# Patient Record
Sex: Female | Born: 1950 | Race: White | Hispanic: No | Marital: Married | Smoking: Never smoker
Health system: Southern US, Community
[De-identification: ages and names within clinical notes are randomized; demographics above are authoritative.]

## PROBLEM LIST (undated history)

## (undated) DIAGNOSIS — K219 Gastro-esophageal reflux disease without esophagitis: Secondary | ICD-10-CM

## (undated) DIAGNOSIS — G4730 Sleep apnea, unspecified: Secondary | ICD-10-CM

## (undated) DIAGNOSIS — K631 Perforation of intestine (nontraumatic): Secondary | ICD-10-CM

## (undated) DIAGNOSIS — F419 Anxiety disorder, unspecified: Secondary | ICD-10-CM

## (undated) DIAGNOSIS — C801 Malignant (primary) neoplasm, unspecified: Secondary | ICD-10-CM

## (undated) DIAGNOSIS — T7840XA Allergy, unspecified, initial encounter: Secondary | ICD-10-CM

## (undated) DIAGNOSIS — I10 Essential (primary) hypertension: Secondary | ICD-10-CM

## (undated) DIAGNOSIS — D649 Anemia, unspecified: Secondary | ICD-10-CM

## (undated) DIAGNOSIS — H269 Unspecified cataract: Secondary | ICD-10-CM

## (undated) DIAGNOSIS — E785 Hyperlipidemia, unspecified: Secondary | ICD-10-CM

## (undated) DIAGNOSIS — D369 Benign neoplasm, unspecified site: Secondary | ICD-10-CM

## (undated) HISTORY — DX: Unspecified cataract: H26.9

## (undated) HISTORY — PX: COLONOSCOPY: SHX174

## (undated) HISTORY — DX: Sleep apnea, unspecified: G47.30

## (undated) HISTORY — PX: RETINAL TEAR REPAIR CRYOTHERAPY: SHX5304

## (undated) HISTORY — DX: Anxiety disorder, unspecified: F41.9

## (undated) HISTORY — DX: Malignant (primary) neoplasm, unspecified: C80.1

## (undated) HISTORY — DX: Perforation of intestine (nontraumatic): K63.1

## (undated) HISTORY — PX: BUNIONECTOMY: SHX129

## (undated) HISTORY — DX: Essential (primary) hypertension: I10

## (undated) HISTORY — PX: POLYPECTOMY: SHX149

## (undated) HISTORY — PX: TUBAL LIGATION: SHX77

## (undated) HISTORY — DX: Anemia, unspecified: D64.9

## (undated) HISTORY — DX: Allergy, unspecified, initial encounter: T78.40XA

## (undated) HISTORY — PX: CHOLECYSTECTOMY: SHX55

## (undated) HISTORY — PX: OTHER SURGICAL HISTORY: SHX169

## (undated) HISTORY — DX: Gastro-esophageal reflux disease without esophagitis: K21.9

## (undated) HISTORY — PX: PARTIAL HYSTERECTOMY: SHX80

## (undated) HISTORY — DX: Benign neoplasm, unspecified site: D36.9

## (undated) HISTORY — DX: Hyperlipidemia, unspecified: E78.5

---

## 1999-05-26 ENCOUNTER — Other Ambulatory Visit: Admission: RE | Admit: 1999-05-26 | Discharge: 1999-05-26 | Payer: Self-pay | Admitting: Gynecology

## 1999-07-26 ENCOUNTER — Encounter (INDEPENDENT_AMBULATORY_CARE_PROVIDER_SITE_OTHER): Payer: Self-pay | Admitting: Specialist

## 1999-07-26 ENCOUNTER — Inpatient Hospital Stay (HOSPITAL_COMMUNITY): Admission: RE | Admit: 1999-07-26 | Discharge: 1999-07-28 | Payer: Self-pay | Admitting: Gynecology

## 2000-05-24 ENCOUNTER — Encounter: Admission: RE | Admit: 2000-05-24 | Discharge: 2000-05-24 | Payer: Self-pay | Admitting: Gynecology

## 2000-05-24 ENCOUNTER — Encounter: Payer: Self-pay | Admitting: Gynecology

## 2000-06-07 ENCOUNTER — Other Ambulatory Visit: Admission: RE | Admit: 2000-06-07 | Discharge: 2000-06-07 | Payer: Self-pay | Admitting: Gynecology

## 2001-06-06 ENCOUNTER — Encounter: Payer: Self-pay | Admitting: Gynecology

## 2001-06-06 ENCOUNTER — Encounter: Admission: RE | Admit: 2001-06-06 | Discharge: 2001-06-06 | Payer: Self-pay | Admitting: Gynecology

## 2001-07-10 ENCOUNTER — Other Ambulatory Visit: Admission: RE | Admit: 2001-07-10 | Discharge: 2001-07-10 | Payer: Self-pay | Admitting: Gynecology

## 2001-12-01 ENCOUNTER — Encounter: Payer: Self-pay | Admitting: Internal Medicine

## 2001-12-01 ENCOUNTER — Inpatient Hospital Stay (HOSPITAL_COMMUNITY): Admission: EM | Admit: 2001-12-01 | Discharge: 2001-12-05 | Payer: Self-pay

## 2001-12-03 ENCOUNTER — Encounter: Payer: Self-pay | Admitting: Internal Medicine

## 2001-12-04 ENCOUNTER — Encounter: Payer: Self-pay | Admitting: Internal Medicine

## 2002-06-12 ENCOUNTER — Encounter: Admission: RE | Admit: 2002-06-12 | Discharge: 2002-06-12 | Payer: Self-pay | Admitting: Gynecology

## 2002-06-12 ENCOUNTER — Encounter: Payer: Self-pay | Admitting: Gynecology

## 2002-07-31 ENCOUNTER — Other Ambulatory Visit: Admission: RE | Admit: 2002-07-31 | Discharge: 2002-07-31 | Payer: Self-pay | Admitting: Gynecology

## 2003-06-18 ENCOUNTER — Encounter: Admission: RE | Admit: 2003-06-18 | Discharge: 2003-06-18 | Payer: Self-pay | Admitting: Gynecology

## 2003-06-18 ENCOUNTER — Encounter: Payer: Self-pay | Admitting: Gynecology

## 2003-08-06 ENCOUNTER — Other Ambulatory Visit: Admission: RE | Admit: 2003-08-06 | Discharge: 2003-08-06 | Payer: Self-pay | Admitting: Gynecology

## 2004-07-01 ENCOUNTER — Encounter: Admission: RE | Admit: 2004-07-01 | Discharge: 2004-07-01 | Payer: Self-pay | Admitting: Family Medicine

## 2004-08-18 ENCOUNTER — Other Ambulatory Visit: Admission: RE | Admit: 2004-08-18 | Discharge: 2004-08-18 | Payer: Self-pay | Admitting: Gynecology

## 2004-09-21 ENCOUNTER — Ambulatory Visit (HOSPITAL_COMMUNITY): Admission: RE | Admit: 2004-09-21 | Discharge: 2004-09-21 | Payer: Self-pay | Admitting: Orthopedic Surgery

## 2004-09-21 ENCOUNTER — Ambulatory Visit (HOSPITAL_BASED_OUTPATIENT_CLINIC_OR_DEPARTMENT_OTHER): Admission: RE | Admit: 2004-09-21 | Discharge: 2004-09-21 | Payer: Self-pay | Admitting: Orthopedic Surgery

## 2005-07-13 ENCOUNTER — Encounter: Admission: RE | Admit: 2005-07-13 | Discharge: 2005-07-13 | Payer: Self-pay | Admitting: Gynecology

## 2005-08-18 ENCOUNTER — Other Ambulatory Visit: Admission: RE | Admit: 2005-08-18 | Discharge: 2005-08-18 | Payer: Self-pay | Admitting: Gynecology

## 2006-07-27 ENCOUNTER — Encounter: Admission: RE | Admit: 2006-07-27 | Discharge: 2006-07-27 | Payer: Self-pay | Admitting: Gynecology

## 2006-09-01 ENCOUNTER — Encounter: Admission: RE | Admit: 2006-09-01 | Discharge: 2006-09-01 | Payer: Self-pay | Admitting: Family Medicine

## 2006-09-06 ENCOUNTER — Other Ambulatory Visit: Admission: RE | Admit: 2006-09-06 | Discharge: 2006-09-06 | Payer: Self-pay | Admitting: Gynecology

## 2006-11-03 ENCOUNTER — Ambulatory Visit: Payer: Self-pay | Admitting: Internal Medicine

## 2006-11-16 ENCOUNTER — Ambulatory Visit: Payer: Self-pay | Admitting: Internal Medicine

## 2007-07-31 ENCOUNTER — Encounter: Admission: RE | Admit: 2007-07-31 | Discharge: 2007-07-31 | Payer: Self-pay | Admitting: Gynecology

## 2007-09-12 ENCOUNTER — Other Ambulatory Visit: Admission: RE | Admit: 2007-09-12 | Discharge: 2007-09-12 | Payer: Self-pay | Admitting: Gynecology

## 2008-08-07 ENCOUNTER — Encounter: Admission: RE | Admit: 2008-08-07 | Discharge: 2008-08-07 | Payer: Self-pay | Admitting: Family Medicine

## 2009-08-13 ENCOUNTER — Encounter: Admission: RE | Admit: 2009-08-13 | Discharge: 2009-08-13 | Payer: Self-pay | Admitting: Family Medicine

## 2010-08-17 ENCOUNTER — Encounter: Admission: RE | Admit: 2010-08-17 | Discharge: 2010-08-17 | Payer: Self-pay | Admitting: Gynecology

## 2011-05-06 NOTE — Discharge Summary (Signed)
[facility]  Patient:    Tami, Stewart Visit Number: [id number] MRN: [id number]          Service Type: SUR Location: [phone] Attending Physician:  Estella Husk Dictated by:   Sammuel Cooper, P.A.-C. Admit Date:  12/01/2001 Discharge Date: 12/05/2001   CC:         Angelia Mould. Derrell Lolling, M.D.  Desma Maxim, M.D.   Discharge Summary  ADMITTING DIAGNOSES: 46. A 60 year old white female with probable sigmoid colon perforation post    colonoscopy with retroperitoneal air, clinically stable on admission. 2. History of adenomatous colon polyps. 3. Diverticulosis. 4. Irritable bowel syndrome. 5. Status post vaginal hysterectomy, unilateral salpingo-oophorectomy, and a    prior laparoscopic cholecystectomy as well as ovarian cystectomy, and    bilateral tubal ligation.  DISCHARGE DIAGNOSES: 1. Stable, status post contained sigmoid perforation with retroperitoneal air    and inflammation, improved. 2. Diarrhea.  C. difficile negative.  Probably secondary to IBS versus    antibiotics. 3. Other diagnoses as listed above.  CONSULTATIONS:  Surgery:  Dr. Derrell Lolling.  PROCEDURES:  CT scan of the abdomen and pelvis x 2 and plain abdominal films.  HISTORY OF PRESENT ILLNESS:  Tami Stewart is a pleasant 60 year old white female well known to Dr. Lina Sar, primary patient of Dr. Donia Guiles, who has history of adenomatous colon polyps and irritable bowel syndrome.  She had undergone follow-up colonoscopy on November 29, 2001.  According to Dr. Delia Chimes colonoscopy report, there was some difficulty traversing the sigmoid colon at 20 cm due to tortuosity.  She was also noted to have diverticulosis but no recurrent polyps.  Patient was discharged post procedure feeling fine and then, later that evening, developed some lower abdominal bloating and pressure associated with shaking chills and some lower abdominal distention.  She said that her appetite  has been decreased, but she had been able to eat small amounts.  She had no complaint of nausea or vomiting and developed some loose bowel movements on the day of admission, December 01, 2001.  She had no complaints of back pain or dysuria.  She presented to the emergency room after calling the office on December 14 and is admitted with abdominal films showing retroperitoneal air.  At the time of admission, WBC is 15.8, hemoglobin 12.8. LABORATORY STUDIES:  On admission, December 14, WBC 15.8, hemoglobin 12.8, hematocrit 37, MCV 95.9, platelets 317.  Follow-up on December 17 showed WBC 6.6, hemoglobin 12, hematocrit 34.1, electrolytes within normal limits, BUN 5, creatinine 0.8, albumin 2.9.  Liver function studies normal on admission. Stool for C. difficile was negative on December 17, repeated on December 18, and is listed as pending in the chart at this time.  X-ray studies:  Plain films of the abdomen on December 14 showed no definite evidence for free intraperitoneal air though there was retroperitoneal gas in the left upper quadrant.  CT of the abdomen and pelvis showed significant retroperitoneal air, no evidence for free intraperitoneal air, small amount of ascites, small hepatic cysts, and renal cysts.  There were inflammatory changes surrounding the portion of the sigmoid colon and a small amount of fluid in the cul de sac as well as multiple diverticula.  Repeat abdominal films on December 16 showed retroperitoneal gas and air fluid levels in the small and large bowel consistent with an ileus.  Follow-up CT of the abdomen and pelvis on December 17 showed little change to slight decrease in the volume of  retroperitoneal air compared to December 14 and some strandiness of the soft tissues, a small amount of fluid particularly around the apex of the sigmoid colon, probably at the site of perforation.  There was no discrete abscess.  HOSPITAL COURSE:  Patient was admitted to the  service of Dr. Yancey Flemings who was covering the hospital.  She was placed on IV Unasyn, kept n.p.o., started on IV fluids, given analgesics and antiemetics as needed.  A surgical consultation was obtained and she was seen by Dr. Derrell Lolling.  Dr. Derrell Lolling felt that she likely had a contained perforation and that there was no obvious indication for surgical intervention but that she would require careful follow-up and to consider laparotomy if she deteriorated.  Patient did have a significant amount of loose stools after CT contrast which then improved. Over the next couple of days, patient continued to look well.  She did have some low-grade temperatures.  Her leukocytosis resolved and she described some pressure in her lower abdomen but no significant pain.  By December 16, she was feeling better, up and walking without difficulty, and continuing to complain of some loose stools.  However, she stated this was fairly normal for her with her irritable bowel.  We started her on sips of liquids on December 16 and then gradually advanced her diet thereafter.  On December 18, the patient had undergone a repeat CT scan of the abdomen and pelvis with findings as outlined above.  She was felt to be stable and improved from a standpoint of retroperitoneal air and with some decreased inflammatory changes. Dr. Juanda Chance felt that she was stable for discharge to home and was discharged with instructions to continue on Cipro 500 mg b.i.d. for seven days and Flagyl 250 mg q.i.d. x 10 days, Vivelle patch as previous, estrogen and testosterone cream as previous, and Tylenol two every 6 hours if needed for discomfort. She was to take it easy, avoid strenuous activity, heavy lifting, etc., to follow a low-residue diet for two weeks, and to follow up with Dr. Lina Sar in the office on Monday, December 23, at 8 a.m., and to call for any problems in the interim. Dictated by:   Sammuel Cooper, P.A.-C.  Attending  Physician:  Estella Husk DD:  01/01/02 TD:  01/01/02 Job: [id number]

## 2011-05-06 NOTE — Consult Note (Signed)
[facility]  Patient:    Tami Stewart, Tami Stewart. Visit Number: [id number] MRN: [id number]          Service Type: Attending:  Angelia Mould. Derrell Lolling, M.D. Dictated by:   Angelia Mould. Derrell Lolling, M.D. Proc. Date: 12/01/01   CC:         Desma Maxim, M.D.  Wilhemina Bonito. Eda Keys., M.D. [facility]   Consultation Report  REASON FOR CONSULTATION:  Evaluate presumed colon perforation.  HISTORY OF PRESENT ILLNESS:  This is a 60 year old white female who has a history of constipation, alternating diarrhea and constipation, and a history of adenomatous polyps of the colon who is followed colonoscopically.  She underwent a routine surveillance colonoscopy following a bowel prep 48 hours ago.  According to the report, it was difficult to traverse the sigmoid colon at 20 cm due to tortuosity.  Diverticulosis was noted, but no recurrence of polyps.  The patient went home, and that evening developed mid and lower abdominal pain which was mild to moderate, shaking chills, and gaseous distention.  She has been anorexic since that time, has eaten a little bit, has had no vomiting.  She has had some non-bloody diarrhea today.  Denies back pain or voiding symptoms.  Does not know if she has had fever or not.  She came to the emergency room today, and was found to have evidence of retroperitoneal air on her x-rays, and is being admitted to Dr. Yancey Flemings, and I have been asked to see her.  PAST MEDICAL HISTORY: 1. History of colon polyps and irritable bowel syndrome. 2. History of bilateral tubal ligation. 3. Status post vaginal hysterectomy with unilateral salpingo-oophorectomy. 4. Status post laparoscopic cholecystectomy, probably by Dr. Samuella Cota. 5. Ovarian cystectomy.  She denies medical problems.  CURRENT MEDICATIONS: 1. Vivelle patch. 2. Estrogen cream. 3. Testosterone.  ALLERGIES:  No known drug allergies.  SOCIAL HISTORY:  The patient is married.  She has a Health and safety inspector job.  Denies  tobacco or alcohol use.  They have two children.  FAMILY HISTORY:  Negative for cancer.  Noncontributory.  REVIEW OF SYSTEMS:  Noncontributory.  PHYSICAL EXAMINATION:  GENERAL:  A pleasant middle aged woman in minimal distress.  She does not appear to be toxic at this time.  VITAL SIGNS:  Temperature 99.1, heart rate 100, respiratory rate 14, blood pressure 128/71.  HEENT:  Sclerae clear.  Extraocular movements intact.  Oropharynx clear.  NECK:  Supple, nontender, no crepitance, no adenopathy, no thyromegaly, no bruit.  LUNGS:  Clear to auscultation without rhonchi or wheeze.  HEART:  Regular rate and rhythm, not tachycardic on this exam.  No murmur.  ABDOMEN:  Bowel sounds are present and the abdomen is soft.  She does have tenderness and guarding diffusely in the mid and lower abdomen.  There is no mass.  EXTREMITIES:  No edema, good pulses.  NEUROLOGIC:  Grossly within normal limits.  ADMISSION LABORATORY DATA:  Hemoglobin 12.8, white blood cell count 15,800.  CT scan shows a fairly significant amount of retroperitoneal air extending in the retroperitoneum bilaterally, but most notable inflammatory changes around the sigmoid colon.  There is no free air.  There is a little bit of free fluid in the pelvis.  The oral contrast goes all the way around through the sigmoid colon, and there is no extravasation of contrast noted.  There is no obstruction noted.  There is some diverticulosis noted in the sigmoid colon.  IMPRESSION: 1. Contained perforation of sigmoid colon, presumably secondary to  recent    instrumentation. 2. No obvious indications for surgical indication at present.  This    perforation may have already spontaneously sealed, and any contamination    may be controlled by antibiotics and bowel rest. 3. I agree with admission to the hospital, bowel rest, and broad spectrum    antibiotics and close clinical followup. 4. We would consider laparotomy if the  patients clinical situation    deteriorates, if she has evidence of ongoing leak, or develops an abscess,    etc.  We will follow with you.Dictated by:   Angelia Mould. Derrell Lolling, M.D.  Attending:  Angelia Mould. Derrell Lolling, M.D. DD:  12/01/01 TD:  12/01/01 Job: [id number]

## 2011-05-06 NOTE — H&P (Signed)
[facility]  Patient:    Tami Stewart, Tami Stewart. Visit Number: [id number] MRN: [id number]          Service Type: Attending:  Wilhemina Bonito. Eda Keys., M.D. [facility] Dictated by:   Sammuel Cooper, P.A. Adm. Date:  12/01/01   CC:         Hedwig Morton. Juanda Chance, M.D. [facility]  Desma Maxim, M.D.  Angelia Mould. Derrell Lolling, M.D.   History and Physical  CHIEF COMPLAINT:  Abdominal bloating and pressure post colonoscopy.  HISTORY OF PRESENT ILLNESS:  Tami Stewart is a pleasant 60 year old white female known to Hedwig Morton. Juanda Chance, M.D., primary patient of Desma Maxim, M.D., who has a history of adenomatous colon polyps and irritable bowel syndrome.  She had undergone followup colonoscopy per Dr. Juanda Chance on November 29, 2001. According to Dr. Delia Chimes report there was some difficulty traversing the sigmoid colon at 20 cm due to tortuosity.  She was also noted to have diverticulosis, but no recurrent polyps.  The patient had gone home feeling fine and then later that evening developed some lower bloating and pressure, shaking chills, and some lower abdominal distention.  She said that her appetite has been decreased, but she had been eating small amounts.  She had no nausea or vomiting, had some loose bowel movements on December 01, 2001.  The patient had no complaint of back pain or dysuria.  She presented to the emergency room after calling on December 01, 2001, and is admitted after abdominal films reveal retroperitoneal air.  At time of admission white blood cell count of 15.8, hemoglobin 12.8, hematocrit 37, and MCV 95.9.  CURRENT MEDICATIONS: 1. Vivelle patch 2 x weekly. 2. Estrogen cream 2 x weekly 3. Testosterone cream q.d.  ALLERGIES:  No known drug allergies.  PAST MEDICAL HISTORY: 1. Pertinent for history of adenomatous colon polyps and irritable bowel    syndrome. 2. Status post bilateral tubal ligation. 3. Status post vaginal hysterectomy and unilateral oophorectomy. 4.  Status post laparoscopic cholecystectomy per Dr. Samuella Cota. 5. Prior ovarian cystectomy.  No other known medical problems.  FAMILY HISTORY:  Negative for GI disease and noncontributory for malignancy.  SOCIAL HISTORY:  The patient is married, has two children, ages 44 and 53. She is a nonsmoker and nondrinker.  REVIEW OF SYSTEMS;  CARDIOVASCULAR:  Denies chest pain or anginal symptoms. PULMONARY:  Negative for cough, shortness of breath, or sputum production. GENITOURINARY:  Negative for dysuria, urgency, or frequency.  GI:  As above.  PHYSICAL EXAMINATION:  VITAL SIGNS:  Temperature 99.1, pulse 100, blood pressure 128/71.  GENERAL:  Well-developed, healthy appearing white female in no acute distress on admission.  HEENT:  Unremarkable.  HEART:  Regular rate and rhythm, S1, S2.  No murmur, rub or gallop.  LUNGS:  Clear to auscultation and percussion.  ABDOMEN:  Soft.  Bowel sounds present.  Tenderness and mild guarding diffusely across the lower abdomen per initial exams.  No mass or hepatosplenomegaly.  RECTAL:  Not done on admission.  EXTREMITIES:  No clubbing, cyanosis, or edema.  Pulses are intact.  NEUROLOGIC:  Grossly nonfocal.  X-RAY:  CT scan of abdomen and pelvis was done on admission showing a significant amount of retroperitoneal air and some inflammatory changes around the sigmoid colon.  Also with diverticula in the left colon.  There was no free intraperitoneal air.  There was a small amount of fluid noted in the pelvis and no extravasation of contrast noted.  IMPRESSION: 1. A 60 year old white female  with probable sigmoid colon perforation post    colonoscopy, November 29, 2001, with retroperitoneal air, clinically stable    on admission. 2. History of adenomatous colon polyps. 3. Diverticulosis. 4. Irritable bowel syndrome. 5. Status post vaginal hysterectomy, unilateral salpingo-oophorectomy, prior    laparoscopic cholecystectomy, and ovarian cystectomy as  well as bilateral    tubal ligation.  PLAN:  The patient is admitted to the service of Wilhemina Bonito. Eda Keys., M.D.  who is covering on call.  She will be kept n.p.o., started on IV Unasyn. Surgical consultation with Dr. Derrell Lolling.  Pain control as needed and close clinical followup with plans for exploratory laparotomy in the even of clinical deterioration. Dictated by:   Sammuel Cooper, P.A. Attending:  Wilhemina Bonito. Eda Keys., M.D. [facility] DD:  12/02/01 TD:  12/02/01 Job: [id number]

## 2011-05-06 NOTE — Op Note (Signed)
NAMEBRIANAH, HOPSON               ACCOUNT NO.:  [id number]   MEDICAL RECORD NO.:  [id number]          PATIENT TYPE:  AMB   LOCATION:  DSC                          FACILITY:  [facility]   PHYSICIAN:  Leonides Grills, M.D.     DATE OF BIRTH:  November 13, 1951   DATE OF PROCEDURE:  09/21/2004  DATE OF DISCHARGE:                                 OPERATIVE REPORT   PREOPERATIVE DIAGNOSES:  1.  Left peroneus brevis tear.  2.  Subluxing left peroneal tendons.  3.  Left ankle impingement.   POSTOPERATIVE DIAGNOSES:  1.  Left peroneus brevis tear.  2.  Subluxing left peroneal tendons.  3.  Left ankle impingement.   OPERATION:  1.  Left ankle arthroscopy with extensive debridement.  2.  Repair, left subluxing peroneal tendons.  3.  Excision, left peroneus brevis tendon.  4.  Left peroneus longus to peroneus brevis tendon transfer.  5.  Left peroneus brevis to peroneus longus tenodesis.   ANESTHESIA:  General endotracheal tube.   SURGEON:  Leonides Grills, M.D.   ASSISTANT:  Lianne Cure, P.A.   ESTIMATED BLOOD LOSS:  Minimal.   TOURNIQUET TIME:  Approximately an hour.   COMPLICATIONS:  None.   DISPOSITION:  Stable to the PAR.   INDICATIONS:  This is a 60 year old female with longstanding anterior as  well as posterolateral ankle pain that was interfering with her life to the  point that she could not do what she wants to do.  She was consented for the  above procedure.  All risks, which include infection, nerve or vessel  injury, persistent pain, worsening pain, weakness in eversion, stiffness,  and arthritis and prolonged recovery, were all explained, questions were  encouraged and answered.   OPERATION:  Patient brought to the operating room and placed in the supine  position after adequate general endotracheal tube anesthesia was  administered as well as Ancef 1 g IV piggyback.  The patient was then placed  in a sloppy lateral position with the operative side up on a bean bag, all  bony prominences were well-padded, and the left lower extremity was then  prepped and draped in a sterile manner over a proximally-placed thigh  tourniquet.  The limb was gravity-exsanguinated and the tourniquet was  elevated to 290 mmHg.  A curvilinear incision was then made over the  posterolateral aspect of the ankle.  Dissection was carried out through  skin.  Hemostasis was obtained.  The peroneal retinaculum was then opened  just approximately 2 mm posterior to the posterolateral edge of the lateral  malleolus.  There was severe amount of tenosynovitis in this area.  The  muscle belly of the brevis was distalized beyond the tip of the lateral  malleolus, and the peroneus brevis was severely tendinosed with tears  involving well over 75-80% of the tendon.  The inferior band of the peroneal  retinaculum near the peroneal tubercle was removed for both the brevis and  longus and a formal extensive tenosynovectomy was then performed with a  muscle debridement.  We also excised the peroneus brevis tendon tear as  well.  Once this was completely debrided and the area was cleaned out, we  then created a bed in the posterolateral corner of the lateral malleolus  using a rongeur and a curved quarter-inch osteotome for the repair of the  subluxing peroneal tendons.  Once this was done and the area was adequately  irrigated, we then transferred the peroneus longus to peroneus brevis tendon  proximally with the ankle in neutral dorsiflexion using 2-0 Fibrewire, then  distally performed a peroneus brevis to peroneus longus tenodesis tendon  transfer using #1 Fibrewire as well.  The posterior aspect of the lateral  malleolus had adequate concavity, and there was no spur formation in this  area as well.  We then repaired the peroneal retinaculum to the groove with  #2 Fibrewire using drill holes, respectively.  This had an excellent repair.  The remaining portion of the peroneal retinaculum was  repaired with 2-0  Fibrewire.  This had an excellent repair.  The area was copiously irrigated  with normal saline, the tourniquet was deflated, hemostasis was obtained.  The wound was closed with 3-0 Vicryl, skin was closed with 4-0 nylon suture.  After the wound was closed, we then mapped out the anatomical landmarks  anteriorly on the ankle, including anterior tibialis, peroneus tertius,  because we could not superficially see the peroneal nerve.  With a spinal  needle and nick and spread technique, the anterior medial portal was then  created.  A blunt-tipped trocar with cannula was then placed, followed by  camera, and under direct visualization the anterolateral portal was then  created just lateral to the peroneus tertius tendon with the nick and spread  technique using a spinal needle.  Once this was done there was a large  amount of synovitis of the entire anterior aspect of the ankle.  This was  extensively debrided with a radiofrequency bevel as well as a shaver.  Once  hemostasis and debridement was adequately done, the gutters were also  debrided for they had synovitis as well.  The accessory tibial-fibular  ligament was rubbing the corner of the talar dome, and there was a large  amount of synovitis in this area.  This was also removed and extensively  debrided as well with the bevel and shaver.  There was no distinct  osteochondral lesion.  Pictures were obtained throughout the procedure.  The  camera was obtained.  The wounds were closed with 4-0 nylon sutures.  A  sterile dressing was applied.  Also, too, prior to posterolateral wound  closure, the tourniquet was deflated and hemostasis was obtained.  After the  wounds were closed, a sterile dressing was applied, a modified Jones  dressing was applied with the ankle in neutral dorsiflexion.  The patient  was stable to the PAR.       PB/MEDQ  D:  09/21/2004  T:  09/21/2004  Job:  [id number]

## 2011-08-10 ENCOUNTER — Other Ambulatory Visit: Payer: Self-pay | Admitting: Gynecology

## 2011-08-10 DIAGNOSIS — Z1231 Encounter for screening mammogram for malignant neoplasm of breast: Secondary | ICD-10-CM

## 2011-08-30 ENCOUNTER — Ambulatory Visit
Admission: RE | Admit: 2011-08-30 | Discharge: 2011-08-30 | Disposition: A | Discharge status: Home / Self Care | Payer: PRIVATE HEALTH INSURANCE | Source: Ambulatory Visit | Attending: Gynecology | Admitting: Gynecology

## 2011-08-30 DIAGNOSIS — Z1231 Encounter for screening mammogram for malignant neoplasm of breast: Secondary | ICD-10-CM

## 2012-09-11 ENCOUNTER — Other Ambulatory Visit: Payer: Self-pay | Admitting: Gynecology

## 2012-09-11 DIAGNOSIS — Z1231 Encounter for screening mammogram for malignant neoplasm of breast: Secondary | ICD-10-CM

## 2012-10-02 ENCOUNTER — Ambulatory Visit
Admission: RE | Admit: 2012-10-02 | Discharge: 2012-10-02 | Disposition: A | Discharge status: Home / Self Care | Payer: BC Managed Care – PPO | Source: Ambulatory Visit | Attending: Gynecology | Admitting: Gynecology

## 2012-10-02 DIAGNOSIS — Z1231 Encounter for screening mammogram for malignant neoplasm of breast: Secondary | ICD-10-CM

## 2013-09-11 ENCOUNTER — Other Ambulatory Visit: Payer: Self-pay

## 2013-09-11 DIAGNOSIS — Z1231 Encounter for screening mammogram for malignant neoplasm of breast: Secondary | ICD-10-CM

## 2013-09-12 ENCOUNTER — Encounter: Payer: Self-pay | Admitting: Internal Medicine

## 2013-10-15 ENCOUNTER — Ambulatory Visit
Admission: RE | Admit: 2013-10-15 | Discharge: 2013-10-15 | Disposition: A | Discharge status: Home / Self Care | Payer: 59 | Source: Ambulatory Visit

## 2013-10-15 DIAGNOSIS — Z1231 Encounter for screening mammogram for malignant neoplasm of breast: Secondary | ICD-10-CM

## 2014-10-14 ENCOUNTER — Other Ambulatory Visit: Payer: Self-pay

## 2014-10-14 DIAGNOSIS — Z1239 Encounter for other screening for malignant neoplasm of breast: Secondary | ICD-10-CM

## 2014-10-30 ENCOUNTER — Other Ambulatory Visit: Payer: Self-pay

## 2014-10-30 DIAGNOSIS — Z1231 Encounter for screening mammogram for malignant neoplasm of breast: Secondary | ICD-10-CM

## 2014-10-31 ENCOUNTER — Ambulatory Visit
Admission: RE | Admit: 2014-10-31 | Discharge: 2014-10-31 | Disposition: A | Discharge status: Home / Self Care | Payer: 59 | Source: Ambulatory Visit

## 2014-10-31 ENCOUNTER — Encounter (INDEPENDENT_AMBULATORY_CARE_PROVIDER_SITE_OTHER): Payer: Self-pay

## 2014-10-31 DIAGNOSIS — Z1231 Encounter for screening mammogram for malignant neoplasm of breast: Secondary | ICD-10-CM

## 2015-02-02 ENCOUNTER — Encounter: Payer: Self-pay | Admitting: Internal Medicine

## 2015-11-18 ENCOUNTER — Other Ambulatory Visit: Payer: Self-pay

## 2015-11-18 DIAGNOSIS — Z1231 Encounter for screening mammogram for malignant neoplasm of breast: Secondary | ICD-10-CM

## 2015-12-04 ENCOUNTER — Ambulatory Visit: Payer: Self-pay

## 2016-01-15 ENCOUNTER — Ambulatory Visit
Admission: RE | Admit: 2016-01-15 | Discharge: 2016-01-15 | Disposition: A | Discharge status: Home / Self Care | Payer: Commercial Managed Care - HMO | Source: Ambulatory Visit

## 2016-01-15 DIAGNOSIS — Z1231 Encounter for screening mammogram for malignant neoplasm of breast: Secondary | ICD-10-CM

## 2016-02-25 DIAGNOSIS — I10 Essential (primary) hypertension: Secondary | ICD-10-CM | POA: Insufficient documentation

## 2016-10-21 ENCOUNTER — Encounter: Payer: Self-pay | Admitting: Gastroenterology

## 2016-12-16 ENCOUNTER — Other Ambulatory Visit: Payer: Self-pay | Admitting: Family Medicine

## 2016-12-16 DIAGNOSIS — Z1231 Encounter for screening mammogram for malignant neoplasm of breast: Secondary | ICD-10-CM

## 2017-01-20 ENCOUNTER — Ambulatory Visit
Admission: RE | Admit: 2017-01-20 | Discharge: 2017-01-20 | Disposition: A | Discharge status: Home / Self Care | Payer: Commercial Managed Care - HMO | Source: Ambulatory Visit | Attending: Family Medicine | Admitting: Family Medicine

## 2017-01-20 DIAGNOSIS — Z1231 Encounter for screening mammogram for malignant neoplasm of breast: Secondary | ICD-10-CM

## 2017-12-26 ENCOUNTER — Other Ambulatory Visit: Payer: Self-pay | Admitting: Family Medicine

## 2017-12-26 DIAGNOSIS — Z1231 Encounter for screening mammogram for malignant neoplasm of breast: Secondary | ICD-10-CM

## 2018-01-26 ENCOUNTER — Ambulatory Visit
Admission: RE | Admit: 2018-01-26 | Discharge: 2018-01-26 | Disposition: A | Discharge status: Home / Self Care | Payer: Commercial Managed Care - HMO | Source: Ambulatory Visit | Attending: Family Medicine | Admitting: Family Medicine

## 2018-01-26 DIAGNOSIS — Z1231 Encounter for screening mammogram for malignant neoplasm of breast: Secondary | ICD-10-CM

## 2018-12-21 DIAGNOSIS — L578 Other skin changes due to chronic exposure to nonionizing radiation: Secondary | ICD-10-CM | POA: Diagnosis not present

## 2018-12-21 DIAGNOSIS — C44722 Squamous cell carcinoma of skin of right lower limb, including hip: Secondary | ICD-10-CM | POA: Diagnosis not present

## 2018-12-28 DIAGNOSIS — M5136 Other intervertebral disc degeneration, lumbar region: Secondary | ICD-10-CM | POA: Diagnosis not present

## 2018-12-28 DIAGNOSIS — M9904 Segmental and somatic dysfunction of sacral region: Secondary | ICD-10-CM | POA: Diagnosis not present

## 2018-12-28 DIAGNOSIS — M5413 Radiculopathy, cervicothoracic region: Secondary | ICD-10-CM | POA: Diagnosis not present

## 2018-12-28 DIAGNOSIS — M9903 Segmental and somatic dysfunction of lumbar region: Secondary | ICD-10-CM | POA: Diagnosis not present

## 2018-12-28 DIAGNOSIS — M5137 Other intervertebral disc degeneration, lumbosacral region: Secondary | ICD-10-CM | POA: Diagnosis not present

## 2018-12-28 DIAGNOSIS — M9901 Segmental and somatic dysfunction of cervical region: Secondary | ICD-10-CM | POA: Diagnosis not present

## 2018-12-28 DIAGNOSIS — M50323 Other cervical disc degeneration at C6-C7 level: Secondary | ICD-10-CM | POA: Diagnosis not present

## 2018-12-28 DIAGNOSIS — M545 Low back pain: Secondary | ICD-10-CM | POA: Diagnosis not present

## 2019-01-03 ENCOUNTER — Other Ambulatory Visit: Payer: Self-pay | Admitting: Family Medicine

## 2019-01-03 DIAGNOSIS — Z1231 Encounter for screening mammogram for malignant neoplasm of breast: Secondary | ICD-10-CM

## 2019-01-17 DIAGNOSIS — C44729 Squamous cell carcinoma of skin of left lower limb, including hip: Secondary | ICD-10-CM | POA: Diagnosis not present

## 2019-01-18 DIAGNOSIS — I10 Essential (primary) hypertension: Secondary | ICD-10-CM | POA: Diagnosis not present

## 2019-01-18 DIAGNOSIS — R8290 Unspecified abnormal findings in urine: Secondary | ICD-10-CM | POA: Diagnosis not present

## 2019-01-18 DIAGNOSIS — M899 Disorder of bone, unspecified: Secondary | ICD-10-CM | POA: Diagnosis not present

## 2019-01-18 DIAGNOSIS — G4733 Obstructive sleep apnea (adult) (pediatric): Secondary | ICD-10-CM | POA: Diagnosis not present

## 2019-01-18 DIAGNOSIS — Z1159 Encounter for screening for other viral diseases: Secondary | ICD-10-CM | POA: Diagnosis not present

## 2019-01-18 DIAGNOSIS — Z23 Encounter for immunization: Secondary | ICD-10-CM | POA: Diagnosis not present

## 2019-01-18 DIAGNOSIS — E782 Mixed hyperlipidemia: Secondary | ICD-10-CM | POA: Diagnosis not present

## 2019-01-18 DIAGNOSIS — N952 Postmenopausal atrophic vaginitis: Secondary | ICD-10-CM | POA: Diagnosis not present

## 2019-01-18 DIAGNOSIS — J309 Allergic rhinitis, unspecified: Secondary | ICD-10-CM | POA: Diagnosis not present

## 2019-01-18 DIAGNOSIS — R197 Diarrhea, unspecified: Secondary | ICD-10-CM | POA: Diagnosis not present

## 2019-01-18 DIAGNOSIS — Z0000 Encounter for general adult medical examination without abnormal findings: Secondary | ICD-10-CM | POA: Diagnosis not present

## 2019-01-18 DIAGNOSIS — F411 Generalized anxiety disorder: Secondary | ICD-10-CM | POA: Diagnosis not present

## 2019-02-01 DIAGNOSIS — R197 Diarrhea, unspecified: Secondary | ICD-10-CM | POA: Diagnosis not present

## 2019-02-08 ENCOUNTER — Ambulatory Visit
Admission: RE | Admit: 2019-02-08 | Discharge: 2019-02-08 | Disposition: A | Discharge status: Home / Self Care | Payer: 59 | Source: Ambulatory Visit | Attending: Family Medicine | Admitting: Family Medicine

## 2019-02-08 DIAGNOSIS — Z1231 Encounter for screening mammogram for malignant neoplasm of breast: Secondary | ICD-10-CM | POA: Diagnosis not present

## 2019-02-13 DIAGNOSIS — G4733 Obstructive sleep apnea (adult) (pediatric): Secondary | ICD-10-CM | POA: Diagnosis not present

## 2019-02-22 DIAGNOSIS — M899 Disorder of bone, unspecified: Secondary | ICD-10-CM | POA: Diagnosis not present

## 2019-02-22 DIAGNOSIS — M85852 Other specified disorders of bone density and structure, left thigh: Secondary | ICD-10-CM | POA: Diagnosis not present

## 2019-03-08 DIAGNOSIS — L570 Actinic keratosis: Secondary | ICD-10-CM | POA: Diagnosis not present

## 2019-04-11 DIAGNOSIS — G4733 Obstructive sleep apnea (adult) (pediatric): Secondary | ICD-10-CM | POA: Diagnosis not present

## 2019-06-07 DIAGNOSIS — M79605 Pain in left leg: Secondary | ICD-10-CM | POA: Diagnosis not present

## 2019-06-07 DIAGNOSIS — R600 Localized edema: Secondary | ICD-10-CM | POA: Diagnosis not present

## 2019-06-07 DIAGNOSIS — L97921 Non-pressure chronic ulcer of unspecified part of left lower leg limited to breakdown of skin: Secondary | ICD-10-CM | POA: Diagnosis not present

## 2019-06-10 DIAGNOSIS — L97921 Non-pressure chronic ulcer of unspecified part of left lower leg limited to breakdown of skin: Secondary | ICD-10-CM | POA: Diagnosis not present

## 2019-06-10 DIAGNOSIS — I8310 Varicose veins of unspecified lower extremity with inflammation: Secondary | ICD-10-CM | POA: Diagnosis not present

## 2019-06-14 DIAGNOSIS — I8310 Varicose veins of unspecified lower extremity with inflammation: Secondary | ICD-10-CM | POA: Diagnosis not present

## 2019-06-14 DIAGNOSIS — L97921 Non-pressure chronic ulcer of unspecified part of left lower leg limited to breakdown of skin: Secondary | ICD-10-CM | POA: Diagnosis not present

## 2019-07-19 DIAGNOSIS — L578 Other skin changes due to chronic exposure to nonionizing radiation: Secondary | ICD-10-CM | POA: Diagnosis not present

## 2019-07-19 DIAGNOSIS — L821 Other seborrheic keratosis: Secondary | ICD-10-CM | POA: Diagnosis not present

## 2019-07-19 DIAGNOSIS — L570 Actinic keratosis: Secondary | ICD-10-CM | POA: Diagnosis not present

## 2019-08-16 DIAGNOSIS — Z01419 Encounter for gynecological examination (general) (routine) without abnormal findings: Secondary | ICD-10-CM | POA: Diagnosis not present

## 2019-08-16 DIAGNOSIS — I8310 Varicose veins of unspecified lower extremity with inflammation: Secondary | ICD-10-CM | POA: Diagnosis not present

## 2019-08-16 DIAGNOSIS — L570 Actinic keratosis: Secondary | ICD-10-CM | POA: Diagnosis not present

## 2019-08-16 DIAGNOSIS — Z90710 Acquired absence of both cervix and uterus: Secondary | ICD-10-CM | POA: Diagnosis not present

## 2019-09-10 DIAGNOSIS — H5203 Hypermetropia, bilateral: Secondary | ICD-10-CM | POA: Diagnosis not present

## 2019-09-10 DIAGNOSIS — H524 Presbyopia: Secondary | ICD-10-CM | POA: Diagnosis not present

## 2019-09-10 DIAGNOSIS — H52222 Regular astigmatism, left eye: Secondary | ICD-10-CM | POA: Diagnosis not present

## 2019-09-21 DIAGNOSIS — Z23 Encounter for immunization: Secondary | ICD-10-CM | POA: Diagnosis not present

## 2019-10-11 DIAGNOSIS — L578 Other skin changes due to chronic exposure to nonionizing radiation: Secondary | ICD-10-CM | POA: Diagnosis not present

## 2019-10-11 DIAGNOSIS — I8310 Varicose veins of unspecified lower extremity with inflammation: Secondary | ICD-10-CM | POA: Diagnosis not present

## 2019-10-11 DIAGNOSIS — L299 Pruritus, unspecified: Secondary | ICD-10-CM | POA: Diagnosis not present

## 2019-10-11 DIAGNOSIS — L820 Inflamed seborrheic keratosis: Secondary | ICD-10-CM | POA: Diagnosis not present

## 2019-10-11 DIAGNOSIS — L570 Actinic keratosis: Secondary | ICD-10-CM | POA: Diagnosis not present

## 2019-11-05 DIAGNOSIS — R350 Frequency of micturition: Secondary | ICD-10-CM | POA: Diagnosis not present

## 2019-11-07 DIAGNOSIS — G4733 Obstructive sleep apnea (adult) (pediatric): Secondary | ICD-10-CM | POA: Diagnosis not present

## 2019-11-08 DIAGNOSIS — L578 Other skin changes due to chronic exposure to nonionizing radiation: Secondary | ICD-10-CM | POA: Diagnosis not present

## 2019-11-08 DIAGNOSIS — I8310 Varicose veins of unspecified lower extremity with inflammation: Secondary | ICD-10-CM | POA: Diagnosis not present

## 2019-11-18 DIAGNOSIS — L97911 Non-pressure chronic ulcer of unspecified part of right lower leg limited to breakdown of skin: Secondary | ICD-10-CM | POA: Diagnosis not present

## 2019-11-18 DIAGNOSIS — I8310 Varicose veins of unspecified lower extremity with inflammation: Secondary | ICD-10-CM | POA: Diagnosis not present

## 2019-12-17 DIAGNOSIS — R300 Dysuria: Secondary | ICD-10-CM | POA: Diagnosis not present

## 2020-01-17 ENCOUNTER — Other Ambulatory Visit: Payer: Self-pay | Admitting: Family Medicine

## 2020-01-17 DIAGNOSIS — Z1231 Encounter for screening mammogram for malignant neoplasm of breast: Secondary | ICD-10-CM

## 2020-01-31 ENCOUNTER — Ambulatory Visit: Payer: PPO

## 2020-01-31 ENCOUNTER — Ambulatory Visit: Payer: PPO | Attending: Internal Medicine

## 2020-01-31 DIAGNOSIS — Z23 Encounter for immunization: Secondary | ICD-10-CM | POA: Insufficient documentation

## 2020-01-31 NOTE — Progress Notes (Signed)
   Covid-19 Vaccination Clinic  Name:  CRAIG WAHLER    MRN: [id number] DOB: [birth year]  01/31/2020  Ms. Goslin was observed post Covid-19 immunization for 15 minutes without incidence. She was provided with Vaccine Information Sheet and instruction to access the V-Safe system.   Ms. Bagge was instructed to call 911 with any severe reactions post vaccine: Marland Kitchen Difficulty breathing  . Swelling of your face and throat  . A fast heartbeat  . A bad rash all over your body  . Dizziness and weakness    Immunizations Administered    Name Date Dose VIS Date Route   Pfizer COVID-19 Vaccine 01/31/2020  6:13 PM 0.3 mL 11/29/2019 Intramuscular   Manufacturer: [place]   Lot: [id number]   [place]: [id number]

## 2020-02-07 DIAGNOSIS — Z85828 Personal history of other malignant neoplasm of skin: Secondary | ICD-10-CM | POA: Diagnosis not present

## 2020-02-07 DIAGNOSIS — J309 Allergic rhinitis, unspecified: Secondary | ICD-10-CM | POA: Diagnosis not present

## 2020-02-07 DIAGNOSIS — F411 Generalized anxiety disorder: Secondary | ICD-10-CM | POA: Diagnosis not present

## 2020-02-07 DIAGNOSIS — M899 Disorder of bone, unspecified: Secondary | ICD-10-CM | POA: Diagnosis not present

## 2020-02-07 DIAGNOSIS — M25511 Pain in right shoulder: Secondary | ICD-10-CM | POA: Diagnosis not present

## 2020-02-07 DIAGNOSIS — E782 Mixed hyperlipidemia: Secondary | ICD-10-CM | POA: Diagnosis not present

## 2020-02-07 DIAGNOSIS — R600 Localized edema: Secondary | ICD-10-CM | POA: Diagnosis not present

## 2020-02-07 DIAGNOSIS — N952 Postmenopausal atrophic vaginitis: Secondary | ICD-10-CM | POA: Diagnosis not present

## 2020-02-07 DIAGNOSIS — Z0000 Encounter for general adult medical examination without abnormal findings: Secondary | ICD-10-CM | POA: Diagnosis not present

## 2020-02-07 DIAGNOSIS — G4733 Obstructive sleep apnea (adult) (pediatric): Secondary | ICD-10-CM | POA: Diagnosis not present

## 2020-02-07 DIAGNOSIS — I10 Essential (primary) hypertension: Secondary | ICD-10-CM | POA: Diagnosis not present

## 2020-02-14 DIAGNOSIS — C44729 Squamous cell carcinoma of skin of left lower limb, including hip: Secondary | ICD-10-CM | POA: Diagnosis not present

## 2020-02-14 DIAGNOSIS — L820 Inflamed seborrheic keratosis: Secondary | ICD-10-CM | POA: Diagnosis not present

## 2020-02-14 DIAGNOSIS — C44719 Basal cell carcinoma of skin of left lower limb, including hip: Secondary | ICD-10-CM | POA: Diagnosis not present

## 2020-02-14 DIAGNOSIS — L299 Pruritus, unspecified: Secondary | ICD-10-CM | POA: Diagnosis not present

## 2020-02-14 DIAGNOSIS — L578 Other skin changes due to chronic exposure to nonionizing radiation: Secondary | ICD-10-CM | POA: Diagnosis not present

## 2020-02-23 ENCOUNTER — Ambulatory Visit: Payer: PPO | Attending: Internal Medicine

## 2020-02-23 DIAGNOSIS — Z23 Encounter for immunization: Secondary | ICD-10-CM | POA: Insufficient documentation

## 2020-02-23 NOTE — Progress Notes (Signed)
   Covid-19 Vaccination Clinic  Name:  Tami Stewart    MRN: [id number] DOB: [birth year]  02/23/2020  [name] was observed post Covid-19 immunization for 15 minutes without incident. She was provided with Vaccine Information Sheet and instruction to access the V-Safe system.   [name] was instructed to call 911 with any severe reactions post vaccine: Marland Kitchen Difficulty breathing  . Swelling of face and throat  . A fast heartbeat  . A bad rash all over body  . Dizziness and weakness   Immunizations Administered    Name Date Dose VIS Date Route   Pfizer COVID-19 Vaccine 02/23/2020  1:35 PM 0.3 mL 11/29/2019 Intramuscular   Manufacturer: [place]   Lot: [id number]   [place]: [id number]

## 2020-02-28 ENCOUNTER — Other Ambulatory Visit: Payer: Self-pay

## 2020-02-28 ENCOUNTER — Ambulatory Visit
Admission: RE | Admit: 2020-02-28 | Discharge: 2020-02-28 | Disposition: A | Discharge status: Home / Self Care | Payer: PPO | Source: Ambulatory Visit | Attending: Family Medicine | Admitting: Family Medicine

## 2020-02-28 DIAGNOSIS — I8311 Varicose veins of right lower extremity with inflammation: Secondary | ICD-10-CM | POA: Diagnosis not present

## 2020-02-28 DIAGNOSIS — Z1231 Encounter for screening mammogram for malignant neoplasm of breast: Secondary | ICD-10-CM

## 2020-02-28 DIAGNOSIS — I8312 Varicose veins of left lower extremity with inflammation: Secondary | ICD-10-CM | POA: Diagnosis not present

## 2020-02-28 DIAGNOSIS — I87323 Chronic venous hypertension (idiopathic) with inflammation of bilateral lower extremity: Secondary | ICD-10-CM | POA: Diagnosis not present

## 2020-02-28 DIAGNOSIS — R600 Localized edema: Secondary | ICD-10-CM | POA: Diagnosis not present

## 2020-03-13 DIAGNOSIS — M7541 Impingement syndrome of right shoulder: Secondary | ICD-10-CM | POA: Diagnosis not present

## 2020-03-13 DIAGNOSIS — M25511 Pain in right shoulder: Secondary | ICD-10-CM | POA: Diagnosis not present

## 2020-03-26 DIAGNOSIS — G4733 Obstructive sleep apnea (adult) (pediatric): Secondary | ICD-10-CM | POA: Diagnosis not present

## 2020-03-26 DIAGNOSIS — G4700 Insomnia, unspecified: Secondary | ICD-10-CM | POA: Diagnosis not present

## 2020-03-27 DIAGNOSIS — I8312 Varicose veins of left lower extremity with inflammation: Secondary | ICD-10-CM | POA: Diagnosis not present

## 2020-03-27 DIAGNOSIS — I8311 Varicose veins of right lower extremity with inflammation: Secondary | ICD-10-CM | POA: Diagnosis not present

## 2020-04-01 DIAGNOSIS — C44729 Squamous cell carcinoma of skin of left lower limb, including hip: Secondary | ICD-10-CM | POA: Diagnosis not present

## 2020-04-24 DIAGNOSIS — M25511 Pain in right shoulder: Secondary | ICD-10-CM | POA: Diagnosis not present

## 2020-04-24 DIAGNOSIS — I8311 Varicose veins of right lower extremity with inflammation: Secondary | ICD-10-CM | POA: Diagnosis not present

## 2020-04-24 DIAGNOSIS — I8312 Varicose veins of left lower extremity with inflammation: Secondary | ICD-10-CM | POA: Diagnosis not present

## 2020-04-24 DIAGNOSIS — I83892 Varicose veins of left lower extremities with other complications: Secondary | ICD-10-CM | POA: Diagnosis not present

## 2020-05-08 DIAGNOSIS — G4733 Obstructive sleep apnea (adult) (pediatric): Secondary | ICD-10-CM | POA: Diagnosis not present

## 2020-05-15 DIAGNOSIS — I8311 Varicose veins of right lower extremity with inflammation: Secondary | ICD-10-CM | POA: Diagnosis not present

## 2020-05-19 DIAGNOSIS — I8311 Varicose veins of right lower extremity with inflammation: Secondary | ICD-10-CM | POA: Diagnosis not present

## 2020-05-22 DIAGNOSIS — C44729 Squamous cell carcinoma of skin of left lower limb, including hip: Secondary | ICD-10-CM | POA: Diagnosis not present

## 2020-05-22 DIAGNOSIS — L570 Actinic keratosis: Secondary | ICD-10-CM | POA: Diagnosis not present

## 2020-05-22 DIAGNOSIS — L578 Other skin changes due to chronic exposure to nonionizing radiation: Secondary | ICD-10-CM | POA: Diagnosis not present

## 2020-06-19 DIAGNOSIS — I8312 Varicose veins of left lower extremity with inflammation: Secondary | ICD-10-CM | POA: Diagnosis not present

## 2020-06-23 DIAGNOSIS — I8312 Varicose veins of left lower extremity with inflammation: Secondary | ICD-10-CM | POA: Diagnosis not present

## 2020-07-03 DIAGNOSIS — I8311 Varicose veins of right lower extremity with inflammation: Secondary | ICD-10-CM | POA: Diagnosis not present

## 2020-07-16 DIAGNOSIS — I8312 Varicose veins of left lower extremity with inflammation: Secondary | ICD-10-CM | POA: Diagnosis not present

## 2020-07-22 DIAGNOSIS — I8311 Varicose veins of right lower extremity with inflammation: Secondary | ICD-10-CM | POA: Diagnosis not present

## 2020-07-22 DIAGNOSIS — L570 Actinic keratosis: Secondary | ICD-10-CM | POA: Diagnosis not present

## 2020-07-22 DIAGNOSIS — L578 Other skin changes due to chronic exposure to nonionizing radiation: Secondary | ICD-10-CM | POA: Diagnosis not present

## 2020-07-22 DIAGNOSIS — C44729 Squamous cell carcinoma of skin of left lower limb, including hip: Secondary | ICD-10-CM | POA: Diagnosis not present

## 2020-07-22 DIAGNOSIS — L918 Other hypertrophic disorders of the skin: Secondary | ICD-10-CM | POA: Diagnosis not present

## 2020-07-31 DIAGNOSIS — I8312 Varicose veins of left lower extremity with inflammation: Secondary | ICD-10-CM | POA: Diagnosis not present

## 2020-08-18 DIAGNOSIS — I8311 Varicose veins of right lower extremity with inflammation: Secondary | ICD-10-CM | POA: Diagnosis not present

## 2020-08-26 DIAGNOSIS — H52222 Regular astigmatism, left eye: Secondary | ICD-10-CM | POA: Diagnosis not present

## 2020-08-26 DIAGNOSIS — H524 Presbyopia: Secondary | ICD-10-CM | POA: Diagnosis not present

## 2020-08-26 DIAGNOSIS — H3562 Retinal hemorrhage, left eye: Secondary | ICD-10-CM | POA: Diagnosis not present

## 2020-08-26 DIAGNOSIS — H43813 Vitreous degeneration, bilateral: Secondary | ICD-10-CM | POA: Diagnosis not present

## 2020-08-26 DIAGNOSIS — H3342 Traction detachment of retina, left eye: Secondary | ICD-10-CM | POA: Diagnosis not present

## 2020-08-26 DIAGNOSIS — H33312 Horseshoe tear of retina without detachment, left eye: Secondary | ICD-10-CM | POA: Diagnosis not present

## 2020-08-26 DIAGNOSIS — H5203 Hypermetropia, bilateral: Secondary | ICD-10-CM | POA: Diagnosis not present

## 2020-08-26 DIAGNOSIS — H35372 Puckering of macula, left eye: Secondary | ICD-10-CM | POA: Diagnosis not present

## 2020-08-26 DIAGNOSIS — H4312 Vitreous hemorrhage, left eye: Secondary | ICD-10-CM | POA: Diagnosis not present

## 2020-09-01 DIAGNOSIS — H33312 Horseshoe tear of retina without detachment, left eye: Secondary | ICD-10-CM | POA: Diagnosis not present

## 2020-09-01 DIAGNOSIS — H4312 Vitreous hemorrhage, left eye: Secondary | ICD-10-CM | POA: Diagnosis not present

## 2020-09-01 DIAGNOSIS — H35372 Puckering of macula, left eye: Secondary | ICD-10-CM | POA: Diagnosis not present

## 2020-09-01 DIAGNOSIS — I8312 Varicose veins of left lower extremity with inflammation: Secondary | ICD-10-CM | POA: Diagnosis not present

## 2020-09-12 DIAGNOSIS — Z23 Encounter for immunization: Secondary | ICD-10-CM | POA: Diagnosis not present

## 2020-09-15 DIAGNOSIS — I8311 Varicose veins of right lower extremity with inflammation: Secondary | ICD-10-CM | POA: Diagnosis not present

## 2020-09-21 DIAGNOSIS — Z01419 Encounter for gynecological examination (general) (routine) without abnormal findings: Secondary | ICD-10-CM | POA: Diagnosis not present

## 2020-09-21 DIAGNOSIS — Z1231 Encounter for screening mammogram for malignant neoplasm of breast: Secondary | ICD-10-CM | POA: Diagnosis not present

## 2020-09-23 DIAGNOSIS — L821 Other seborrheic keratosis: Secondary | ICD-10-CM | POA: Diagnosis not present

## 2020-09-23 DIAGNOSIS — L570 Actinic keratosis: Secondary | ICD-10-CM | POA: Diagnosis not present

## 2020-10-02 DIAGNOSIS — H4312 Vitreous hemorrhage, left eye: Secondary | ICD-10-CM | POA: Diagnosis not present

## 2020-10-02 DIAGNOSIS — H33312 Horseshoe tear of retina without detachment, left eye: Secondary | ICD-10-CM | POA: Diagnosis not present

## 2020-10-02 DIAGNOSIS — H35372 Puckering of macula, left eye: Secondary | ICD-10-CM | POA: Diagnosis not present

## 2020-10-02 DIAGNOSIS — H43393 Other vitreous opacities, bilateral: Secondary | ICD-10-CM | POA: Diagnosis not present

## 2020-10-02 DIAGNOSIS — H43813 Vitreous degeneration, bilateral: Secondary | ICD-10-CM | POA: Diagnosis not present

## 2020-10-09 DIAGNOSIS — I8312 Varicose veins of left lower extremity with inflammation: Secondary | ICD-10-CM | POA: Diagnosis not present

## 2020-10-09 DIAGNOSIS — I890 Lymphedema, not elsewhere classified: Secondary | ICD-10-CM | POA: Diagnosis not present

## 2020-10-15 DIAGNOSIS — H33312 Horseshoe tear of retina without detachment, left eye: Secondary | ICD-10-CM | POA: Diagnosis not present

## 2020-10-15 DIAGNOSIS — H35372 Puckering of macula, left eye: Secondary | ICD-10-CM | POA: Diagnosis not present

## 2020-10-16 DIAGNOSIS — H33312 Horseshoe tear of retina without detachment, left eye: Secondary | ICD-10-CM | POA: Diagnosis not present

## 2020-10-22 DIAGNOSIS — H35372 Puckering of macula, left eye: Secondary | ICD-10-CM | POA: Diagnosis not present

## 2020-10-22 DIAGNOSIS — H33312 Horseshoe tear of retina without detachment, left eye: Secondary | ICD-10-CM | POA: Diagnosis not present

## 2020-11-17 DIAGNOSIS — H35372 Puckering of macula, left eye: Secondary | ICD-10-CM | POA: Diagnosis not present

## 2020-11-20 DIAGNOSIS — R5383 Other fatigue: Secondary | ICD-10-CM | POA: Diagnosis not present

## 2020-12-02 DIAGNOSIS — H33302 Unspecified retinal break, left eye: Secondary | ICD-10-CM | POA: Diagnosis not present

## 2020-12-02 DIAGNOSIS — H25813 Combined forms of age-related cataract, bilateral: Secondary | ICD-10-CM | POA: Diagnosis not present

## 2020-12-02 DIAGNOSIS — H35372 Puckering of macula, left eye: Secondary | ICD-10-CM | POA: Diagnosis not present

## 2020-12-02 DIAGNOSIS — H33312 Horseshoe tear of retina without detachment, left eye: Secondary | ICD-10-CM | POA: Diagnosis not present

## 2020-12-02 DIAGNOSIS — H52222 Regular astigmatism, left eye: Secondary | ICD-10-CM | POA: Diagnosis not present

## 2020-12-02 DIAGNOSIS — H5203 Hypermetropia, bilateral: Secondary | ICD-10-CM | POA: Diagnosis not present

## 2020-12-02 DIAGNOSIS — H524 Presbyopia: Secondary | ICD-10-CM | POA: Diagnosis not present

## 2020-12-22 ENCOUNTER — Ambulatory Visit: Payer: PPO | Attending: Family Medicine | Admitting: Occupational Therapy

## 2020-12-22 ENCOUNTER — Other Ambulatory Visit: Payer: Self-pay

## 2020-12-22 DIAGNOSIS — I890 Lymphedema, not elsewhere classified: Secondary | ICD-10-CM | POA: Insufficient documentation

## 2020-12-23 ENCOUNTER — Encounter: Payer: Self-pay | Admitting: Occupational Therapy

## 2020-12-23 NOTE — Therapy (Signed)
[facility] [facility] MAIN [facility] SERVICES [address] [place], [place], [address] Phone: [phone]   Fax:  [phone]  Occupational Therapy Evaluation: Lymphedema Care  Patient Details  Name: Tami Stewart MRN: [id number] Date of Birth: 1951-09-02 Referring Provider (OT): Coralie Carpen, MD   Encounter Date: 12/22/2020   OT End of Session - 12/23/20 1242    Number of Visits 36    Date for OT Re-Evaluation 03/22/21    Activity Tolerance Patient tolerated treatment well;No increased pain    Behavior During Therapy [facility] for tasks assessed/performed           History reviewed. No pertinent past medical history.  History reviewed. No pertinent surgical history.  There were no vitals filed for this visit.   Subjective Assessment - 12/23/20 1521    Subjective  Tami Stewart is referred to Occupational Therapy by Coralie Carpen, MD for evaluation and treatment of longstanding, BLE lymphedema. Pt reports onset of leg swelling coincided with her 2nd pregnancy about 35 years ago. Pt reports positive family history of leg swelling and poor circulation in maternal grandmother, mother and brother. Pt reports pain in her legs has largely resolved since undergoing ablation procedures, but reports swelling ,although decreased post ablation, persists. , Skin tightness and redness below the knees also persists. Pt has not previously undergone complete decongestive therapy (CDT) for lymphedema care. She has worn elastic compression stockings intermittently in the past, but states they are uncomfortable, and she refuses compression garments in summer.    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 has thigh length "compression boots" 1 x daily; No prior CDT; underwent ETA and ECA      Precautions   Precaution Comments LE precautions      Restrictions   Weight Bearing Restrictions No      Balance Screen   Has the patient fallen in the past 6 months No    Has the patient had a decrease in activity level because of a fear of falling?  No    Is the patient reluctant to leave their home because of a fear of falling?  No      Home  Environment   Family/patient expects to be discharged to: Private residence    Living Arrangements Spouse/significant other    Available Help at Discharge Family    Type of Home House    Home Access Stairs    Bathroom Shower/Tub Tub/Shower unit    Lives With Spouse      Prior Function   Level of Independence Independent;Independent with household mobility without device;Independent with community mobility without device;Independent with gait;Independent with transfers    Vocation  Retired    Leisure family      IADL   Shopping Takes care of all shopping needs independently   requires occasional seated rest breaks during activities requiring extended standing, walking and/ or dependent sitting due to increased leg pain and swelling   Prior Level of Function Light Housekeeping I    Light Housekeeping Does personal laundry completely;Launders small items, rinses stockings, etc.;Maintains house alone or with occasional assistance;Performs light daily tasks such as dishwashing, bed making    requires occasional seated rest breaks during all activities requiring extended standing, walking and/ or dependent sitting due to increased leg pain and swelling   Prior Level of Function Meal Prep I    Meal Prep Plans, prepares and serves adequate meals independently   requires occasional seated rest breaks during activities requiring extended standing, walking and/ or dependent sitting due to increased leg pain and swelling   Prior Level of Function Community Mobility I    Programmer, applications own vehicle   long  distance car travel increases leg discomfort and swelling     Mobility   Mobility Status Independent    Mobility Status Comments requires occasional seated rest breaks during activities requiring extended standing, walking and/ or dependent sitting due to increased leg pain and swelling      Activity Tolerance   Activity Tolerance Endurance does not limit participation in activity    Activity Tolerance Comments activity limited by increased leg pain and swelling, not decreased endurance      Cognition   Overall Cognitive Status Within Functional Limits for tasks assessed      Observation/Other Assessments   Outcome Measures FOTO: TBA OT Rx visit 2      Posture/Postural Control   Posture/Postural Control No significant limitations      Sensation   Light Touch Appears Intact      Coordination   Gross Motor Movements are Fluid and Coordinated Yes      Hand Function   Right Hand Grip (lbs) WFL    Left Hand Grip (lbs) WFL         Moderate stage II, BLE lymphedema 2/2 CVI Skin  Description Hyper-Keratosis Peau' de Orange Shiny Tight Fibrotic/ Indurated Fatty Doughy spongy     x x  Moderate induration below the knees, L>R        Skin dry Flaky WNL Macerated   mild      Color Redness Present Pallor Blanching Hemosiderin Staining Other   x        Odor Malodorous Yeast Fungal infection  Absent      x   Temperature Warm Cool wnl    x     Pitting  Edema   1+ 2+ 3+ 4+ Non-pitting        x   Girth Symmetrical Asymmetrical                   Distribution    L>R  Toes to groin with densest concentration below knees to toes    Stemmer Sign Positive Negative   L>R +    Lymphorrhea History Of:  Present Absent     x    Wounds History Of Present Absent Venous Arterial Pressure Mechanical     x        Signs of Infection Redness Warmth Erythema Acute Swelling Drainage Borders                    Sensation Light Touch Deep  pressure Hypersensitivty   Present Impaired Present Impaired Absent Impaired   x  x  x     Nails WNL Fungus nail dystrophy   x    x Hair Growth Symmetrical Asymmetrical   x    Skin Creases Base of toes  Ankles   Base of Fingers Medial Thighs         Abdominal pannus Medial legs  Face/neck   X deep x            LYMPHEDEMA/ONCOLOGY QUESTIONNAIRE - 12/23/20 0001      Lymphedema Assessments   Lymphedema Assessments Lower extremities      Left Lower Extremity Lymphedema   Other BLE comparative limb volumetrics TBA next visit                  OT Treatments/Exercises (OP) - 12/23/20 0001      Bed Mobility   Bed Mobility --   WNL     Transfers   Transfers --   [facility]     ADLs   Overall ADLs Basic and INstrumental ADLs requiring standing, walking and or dependent sitting > 15 minutes limited by leg pain and swelling    LB Dressing impaired- difficulty fitting preferred street shoes, socks and LB clothing due to swelling in legas and feet    Work linted performance due to leg pain and swelling    ADL Comments Leisure activities requiring extended depenndent sitting, standing and/ or walking limited by leg pain and swelling after ~ 15 mins      Manual Therapy   Manual Therapy Edema management                 OT Education - 12/22/20 1032    Education Details Provided Pt education regarding lymphatic structure and function, etiologies, onset patterns and stages of  progression. Discussed  impact of obesity on lymphatic function. Outlined Complete Decongestive Therapy (CDT)  as standard of care and provided in depth information regarding 4 primary components of both Intensive and Self Management Phases, including Manual Lymph Drainage (MLD), compression wrapping and garments, skin care, and therapeutic exercise.   Homero Fellers discussion of high burden of care and contributing impact of existing co morbidities. We discussed  Importance of daily, ongoing LE self-care essential to retaining clinical gains and limiting progression.  Lastly, reviewed lymphedema precautions, including cellulitis risk and difficulty with wound healing. Provided printed Lymphedema Workbook for reference.    Person(s) Educated Patient    Methods Explanation;Demonstration;Handout    Comprehension Verbalized understanding;Returned demonstration               OT Long Term Goals - 12/23/20 1246      OT LONG TERM GOAL #1   Title Pt will be able to apply multi-layer, short stretch compression wraps using correct gradient techniques with extra time (modified assist)  to return affected limb to premorbid size and shape, to limit leg pain and infection risk, and to improve safe functional ambulation and mobility.    Baseline dependent    Time 4    Period Days    Status New    Target Date --   4th OT Rx visit     OT LONG TERM GOAL #2   Title Patient will demonstrate understanding of LE precautions, prevention strategies and cellulitis signs and symptoms by verbalizing at least 5 examples using a printed reference (modified independence) to limit infection risk and LE progression.    Baseline dependent    Time  4    Period Days    Status New    Target Date --   4th OT Rx visit     OT LONG TERM GOAL #3   Title Pt will achieve no less than 10% limb volume reductions bilaterally below the knees (ankle to tibial tuberosity = A-D) during Intensive Phase CDT to achieve optimal limb volume  reduction and to prevent re-accumulation of lymphatic congestion and fibrosis in the leg. limit infection risk, to improve functional ambulation and transfers, to improve functional performance of basic and instrumental ADLs, and to limit LE progression.    Baseline dependent    Time 12    Period Weeks    Status New    Target Date 03/23/21      OT LONG TERM GOAL #4   Title Pt will demonstrate and sustain a least 85% compliance performing all daily LE self-care home program components daily throughout Intensive Phase CDT daily, including recommended skin care regime, lymphatic pumping ther ex, 23/7 compression wraps and simple self-MLD, to ensure optimal limb volume reduction, to limit infection risk and to limit LE progression.    Baseline dependent    Time 12    Period Weeks    Status New    Target Date 03/23/21      OT LONG TERM GOAL #5   Title Using assistive devices (modified independence) Pt will be able to don and doff appropriate daytime compression garments and HOS devices to limit lymphatic re-accumulation and LE progression.    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 Edema;ADL;Decreased knowledge of use of DME;Pain;Scar mobility;Flexibility;Decreased knowledge of precautions;Skin integrity    Rehab Potential Good    Clinical Decision Making Several treatment options, min-mod task modification necessary    Comorbidities Affecting Occupational Performance: Presence of comorbidities impacting occupational performance     Comorbidities impacting occupational performance description: varicose vein disease, CVI    Modification or Assistance to Complete Evaluation  Min-Moderate modification of tasks or assist with assess necessary to complete eval    OT Frequency 2x / week    OT Duration 12 weeks    OT Treatment/Interventions Self-care/ADL training;DME and/or AE instruction;Manual lymph drainage;Compression bandaging;Therapeutic exercise;Other (comment);Manual Therapy;Patient/family education   skin care w low ph lotion and castor oil to improve skin hydration and mobility'   Plan BLE complete decongestive therapy (CDT) on 1 leg at a time to limit falls risk. To include MLD, compression bandaging and garments, skin care, ther ex Pt self care edu    Recommended Other Services fit with custom, flat knit compression stockings. Consider thigh highs as tolerated.    Consulted and Agree with Plan of Care Patient           Patient will benefit from skilled therapeutic intervention in order to improve the following deficits and impairments:   Body Structure / Function / Physical Skills: Edema,ADL,Decreased knowledge of use of DME,Pain,Scar mobility,Flexibility,Decreased knowledge of precautions,Skin integrity  Visit Diagnosis: Lymphedema, not elsewhere classified - Plan: Ot plan of care cert/re-cert    Problem List There are no problems to display for this patient.   Loel Dubonnet, MS, OTR/L, [facility] 12/23/20 3:30 PM  [place] [facility] MAIN [facility] SERVICES [address] [place], [place], [address] Phone: [phone]   Fax:  [phone]  Name: Tami Stewart MRN: [id number] Date of Birth: 03-Mar-1951

## 2020-12-23 NOTE — Patient Instructions (Signed)

## 2020-12-24 ENCOUNTER — Ambulatory Visit: Payer: PPO | Admitting: Occupational Therapy

## 2020-12-28 ENCOUNTER — Ambulatory Visit: Payer: PPO | Admitting: Occupational Therapy

## 2020-12-30 ENCOUNTER — Encounter: Payer: PPO | Admitting: Occupational Therapy

## 2020-12-30 DIAGNOSIS — L578 Other skin changes due to chronic exposure to nonionizing radiation: Secondary | ICD-10-CM | POA: Diagnosis not present

## 2020-12-30 DIAGNOSIS — I8310 Varicose veins of unspecified lower extremity with inflammation: Secondary | ICD-10-CM | POA: Diagnosis not present

## 2020-12-30 DIAGNOSIS — L814 Other melanin hyperpigmentation: Secondary | ICD-10-CM | POA: Diagnosis not present

## 2020-12-30 DIAGNOSIS — L570 Actinic keratosis: Secondary | ICD-10-CM | POA: Diagnosis not present

## 2020-12-30 DIAGNOSIS — L821 Other seborrheic keratosis: Secondary | ICD-10-CM | POA: Diagnosis not present

## 2020-12-31 ENCOUNTER — Ambulatory Visit: Payer: PPO | Admitting: Occupational Therapy

## 2020-12-31 ENCOUNTER — Other Ambulatory Visit: Payer: Self-pay

## 2020-12-31 DIAGNOSIS — I890 Lymphedema, not elsewhere classified: Secondary | ICD-10-CM | POA: Diagnosis not present

## 2020-12-31 NOTE — Therapy (Signed)
[place] MAIN [facility] SERVICES [address] [place], [place], [address] Phone: [phone]   Fax:  [phone]  Occupational Therapy Treatment  Patient Details  Name: Tami Stewart MRN: [id number] Date of Birth: 1951/01/29 Referring Provider (OT): Elza Rafter, MD   Encounter Date: 12/31/2020   OT End of Session - 12/31/20 1620    Visit Number 2    Number of Visits 36    Date for OT Re-Evaluation 03/22/21    OT Start Time 0100    OT Stop Time 0208    OT Time Calculation (min) 68 min    Activity Tolerance Patient tolerated treatment well;No increased pain    Behavior During Therapy [facility] for tasks assessed/performed           No past medical history on file.  No past surgical history on file.  There were no vitals filed for this visit.   Subjective Assessment - 12/31/20 1313    Subjective  Tami Stewart presents for OT visit 2/36 to address BLE lymphedema. Pt denies leg pain related to swelling today. Pt has new complaints since initial eval last week.    Pertinent History chronic phlebolymphedema, lipodermatosclerosis, varicose vein disease, Dopler + for reflux, endovenous thermal ablation followed by chemical ablation, OSA (uses CPAP)    Limitations decreased standing and waling tolerance, chronic leg pain and swelling,    Repetition Increases Symptoms    Special Tests + Stemmer base of toes bilaterally w/ deep creases at base of toes    Patient Stated Goals reduce leg swelling ; limit progression               LYMPHEDEMA/ONCOLOGY QUESTIONNAIRE - 12/31/20 0001      Right Lower Extremity Lymphedema   Other RLE limb volume for leg (A-D)= 3268.8 ml. RLE thigh volume= 3885.9 ml. RLE full limb volume (A-G)= 7154.8 ml.    Other Limb volume differential for leg measures 4.6%, L>R. LVD for thigh measures 4.02%, R>L. LVD for full limb measures 0.09%, R>L. These values reveal essential symettrical limb volumes WNL. Limb volumes are  symetrical below the knees w/ LLE slightly more swollen than RLE.                   OT Treatments/Exercises (OP) - 12/31/20 0001      ADLs   ADL Education Given Yes      Manual Therapy   Manual Therapy Edema management;Compression Bandaging    Edema Management initial comparative limb volumetrics.    Compression Bandaging knee length compression wrap using short stretch bandages, 8,10, and 12 cm, over Rosidal foam and stockett in gradient configuration                  OT Education - 12/31/20 1619    Education Details Intro level teaching for multilayer gradient compression wraps- rational and techniques. Pt edu re outcome of initial comparative volumetrics    Person(s) Educated Patient    Methods Explanation;Demonstration;Handout    Comprehension Verbalized understanding;Returned demonstration               OT Long Term Goals - 12/23/20 1246      OT LONG TERM GOAL #1   Title Pt will be able to apply multi-layer, short stretch compression wraps using correct gradient techniques with extra time (modified assist)  to return affected limb to premorbid size and shape, to limit leg pain and infection risk, and to improve safe functional ambulation and mobility.  Baseline dependent    Time 4    Period Days    Status New    Target Date --   4th OT Rx visit     OT LONG TERM GOAL #2   Title Patient will demonstrate understanding of LE precautions, prevention strategies and cellulitis signs and symptoms by verbalizing at least 5 examples using a printed reference (modified independence) to limit infection risk and LE progression.    Baseline dependent    Time 4    Period Days    Status New    Target Date --   4th OT Rx visit     OT LONG TERM GOAL #3   Title Pt will achieve no less than 10% limb volume reductions bilaterally below the knees (ankle to tibial tuberosity = A-D) during Intensive Phase CDT to achieve optimal limb volume reduction and to prevent  re-accumulation of lymphatic congestion and fibrosis in the leg. limit infection risk, to improve functional ambulation and transfers, to improve functional performance of basic and instrumental ADLs, and to limit LE progression.    Baseline dependent    Time 12    Period Weeks    Status New    Target Date 03/23/21      OT LONG TERM GOAL #4   Title Pt will demonstrate and sustain a least 85% compliance performing all daily LE self-care home program components daily throughout Intensive Phase CDT daily, including recommended skin care regime, lymphatic pumping ther ex, 23/7 compression wraps and simple self-MLD, to ensure optimal limb volume reduction, to limit infection risk and to limit LE progression.    Baseline dependent    Time 12    Period Weeks    Status New    Target Date 03/23/21      OT LONG TERM GOAL #5   Title Using assistive devices (modified independence) Pt will be able to don and doff appropriate daytime compression garments and HOS devices to limit lymphatic re-accumulation and LE progression.    Baseline Max A    Time 12    Period Weeks    Status New    Target Date 03/23/21                 Plan - 12/31/20 1625    Clinical Impression Statement Limb volume differential for leg measures 4.6%, L>R. LVD for thigh measures 4.02%, R>L. LVD for full limb measures 0.09%, R>L. These values reveal essential symettrical limb volumes WNL. Limb volumes are symetrical below the knees w/ L leg slightly more swollen than R leg. Applied initial multilayer gradient wrap with good tolerance in clinic. Pt instructed in precautins. Cont as per POC. Next visit emphasis on teaching Pt to apply compression wraps.    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 Edema;ADL;Decreased knowledge of use of DME;Pain;Scar mobility;Flexibility;Decreased knowledge of precautions;Skin integrity    Rehab Potential Good    Clinical Decision Making Several treatment options, min-mod task modification necessary    Comorbidities Affecting Occupational Performance: Presence of comorbidities impacting occupational performance    Comorbidities impacting occupational performance description: varicose vein disease, CVI    Modification or Assistance to Complete Evaluation  Min-Moderate modification of tasks or assist with assess necessary to complete eval  OT Frequency 2x / week    OT Duration 12 weeks    OT Treatment/Interventions Self-care/ADL training;DME and/or AE instruction;Manual lymph drainage;Compression bandaging;Therapeutic exercise;Other (comment);Manual Therapy;Patient/family education   skin care w low ph lotion and castor oil to improve skin hydration and mobility'   Plan BLE complete decongestive therapy (CDT) on 1 leg at a time to limit falls risk. To include MLD, compression bandaging and garments, skin care, ther ex Pt self care edu    Recommended Other Services fit with custom, flat knit compression stockings. Consider thigh highs as tolerated.    Consulted and Agree with Plan of Care Patient           Patient will benefit from skilled therapeutic intervention in order to improve the following deficits and impairments:   Body Structure / Function / Physical Skills: Edema,ADL,Decreased knowledge of use of DME,Pain,Scar mobility,Flexibility,Decreased knowledge of precautions,Skin integrity       Visit Diagnosis: Lymphedema, not elsewhere classified    Problem List There are no problems to display for this patient.   Andrey Spearman, MS, OTR/L, [facility] 12/31/20 4:27 PM   [place] MAIN [facility]  SERVICES [address] [place], [place], [address] Phone: [phone]   Fax:  [phone]  Name: Tami Stewart MRN: [id number] Date of Birth: 06-Oct-1951

## 2021-01-04 ENCOUNTER — Encounter: Payer: PPO | Admitting: Occupational Therapy

## 2021-01-06 ENCOUNTER — Ambulatory Visit: Payer: PPO | Admitting: Occupational Therapy

## 2021-01-06 DIAGNOSIS — I890 Lymphedema, not elsewhere classified: Secondary | ICD-10-CM | POA: Diagnosis not present

## 2021-01-06 NOTE — Therapy (Signed)
[place] MAIN [facility] SERVICES [address] [place], [place], [address] Phone: [phone]   Fax:  [phone]  Occupational Therapy Treatment  Patient Details  Name: Tami Stewart MRN: [id number] Date of Birth: 1951/04/25 Referring Provider (OT): Elza Rafter, MD   Encounter Date: 01/06/2021   OT End of Session - 01/06/21 1419    Visit Number 3    Number of Visits 36    Date for OT Re-Evaluation 03/22/21    OT Start Time 0108    OT Stop Time 0208    OT Time Calculation (min) 60 min    Activity Tolerance Patient tolerated treatment well;No increased pain    Behavior During Therapy [facility] for tasks assessed/performed           No past medical history on file.  No past surgical history on file.  There were no vitals filed for this visit.   Subjective Assessment - 01/06/21 1414    Subjective  Tami Stewart presents for OT visit 3/36 to address BLE lymphedema. Pt presents wearing LLE knee length gradient compression wraps applied by her spouse during visit intrerval. Pt tells me the wraps feel good and her husband learned wrapping technique quickly . "I taught him based on what I remembered." Pt has no new complaints or concerns, and denies LE related leg pain.    Pertinent History chronic phlebolymphedema, lipodermatosclerosis, varicose vein disease, Dopler + for reflux, endovenous thermal ablation followed by chemical ablation, OSA (uses CPAP)    Limitations decreased standing and waling tolerance, chronic leg pain and swelling,    Repetition Increases Symptoms    Special Tests + Stemmer base of toes bilaterally w/ deep creases at base of toes    Patient Stated Goals reduce leg swelling ; limit progression                        OT Treatments/Exercises (OP) - 01/06/21 0001      ADLs   ADL Education Given Yes      Manual Therapy   Manual Therapy Edema management;Manual Lymphatic Drainage (MLD);Compression Bandaging     Manual Lymphatic Drainage (MLD) MLD to LLE utilizing short neck sequence, deep abdominal breathing, functional inguinal LN, and proximal to distal J stroke sequences from thigh to foot. Excellent tolerance.    Compression Bandaging knee length compression wrap using short stretch bandages, 8,10, and 12 cm, over Rosidal foam and stockett in gradient configuration                  OT Education - 01/06/21 1418    Education Details Cont Pt edu for multilayer gradient compression from base of toes to below knee. Intro level edu for simple self MLD. God return.    Person(s) Educated Patient    Methods Explanation;Demonstration;Handout    Comprehension Verbalized understanding;Returned demonstration               OT Long Term Goals - 12/23/20 1246      OT LONG TERM GOAL #1   Title Pt will be able to apply multi-layer, short stretch compression wraps using correct gradient techniques with extra time (modified assist)  to return affected limb to premorbid size and shape, to limit leg pain and infection risk, and to improve safe functional ambulation and mobility.    Baseline dependent    Time 4    Period Days    Status New    Target Date --  4th OT Rx visit     OT LONG TERM GOAL #2   Title Patient will demonstrate understanding of LE precautions, prevention strategies and cellulitis signs and symptoms by verbalizing at least 5 examples using a printed reference (modified independence) to limit infection risk and LE progression.    Baseline dependent    Time 4    Period Days    Status New    Target Date --   4th OT Rx visit     OT LONG TERM GOAL #3   Title Pt will achieve no less than 10% limb volume reductions bilaterally below the knees (ankle to tibial tuberosity = A-D) during Intensive Phase CDT to achieve optimal limb volume reduction and to prevent re-accumulation of lymphatic congestion and fibrosis in the leg. limit infection risk, to improve functional ambulation and  transfers, to improve functional performance of basic and instrumental ADLs, and to limit LE progression.    Baseline dependent    Time 12    Period Weeks    Status New    Target Date 03/23/21      OT LONG TERM GOAL #4   Title Pt will demonstrate and sustain a least 85% compliance performing all daily LE self-care home program components daily throughout Intensive Phase CDT daily, including recommended skin care regime, lymphatic pumping ther ex, 23/7 compression wraps and simple self-MLD, to ensure optimal limb volume reduction, to limit infection risk and to limit LE progression.    Baseline dependent    Time 12    Period Weeks    Status New    Target Date 03/23/21      OT LONG TERM GOAL #5   Title Using assistive devices (modified independence) Pt will be able to don and doff appropriate daytime compression garments and HOS devices to limit lymphatic re-accumulation and LE progression.    Baseline Max A    Time 12    Period Weeks    Status New    Target Date 03/23/21                 Plan - 01/06/21 1420    Clinical Impression Statement LLE well decongested below the knee today. Decongestion allows [place] to easier see and palpate fibrosis around ankle and distal leg. Pt tolerated MLD to LLE without increased pain. Pt fully engaged during all edu for LE self care. Corrected minor bandage technique discrepancies. Overall her spouse did an excellent job.    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 Edema;ADL;Decreased knowledge of use of DME;Pain;Scar mobility;Flexibility;Decreased knowledge of precautions;Skin integrity    Rehab Potential Good     Clinical Decision Making Several treatment options, min-mod task modification necessary    Comorbidities Affecting Occupational Performance: Presence of comorbidities impacting occupational performance    Comorbidities impacting occupational performance description: varicose vein disease, CVI    Modification or Assistance to Complete Evaluation  Min-Moderate modification of tasks or assist with assess necessary to complete eval    OT Frequency 2x / week    OT Duration 12 weeks    OT Treatment/Interventions Self-care/ADL training;DME and/or AE instruction;Manual lymph drainage;Compression bandaging;Therapeutic exercise;Other (comment);Manual Therapy;Patient/family education   skin care w low ph lotion and castor oil to improve skin hydration  and mobility'   Plan BLE complete decongestive therapy (CDT) on 1 leg at a time to limit falls risk. To include MLD, compression bandaging and garments, skin care, ther ex Pt self care edu    Recommended Other Services fit with custom, flat knit compression stockings. Consider thigh highs as tolerated.    Consulted and Agree with Plan of Care Patient           Patient will benefit from skilled therapeutic intervention in order to improve the following deficits and impairments:   Body Structure / Function / Physical Skills: Edema,ADL,Decreased knowledge of use of DME,Pain,Scar mobility,Flexibility,Decreased knowledge of precautions,Skin integrity       Visit Diagnosis: Lymphedema, not elsewhere classified    Problem List There are no problems to display for this patient.   Andrey Spearman, MS, OTR/L, [facility] 01/06/21 2:24 PM  [place] MAIN [facility] SERVICES [address] [place], [place], [address] Phone: [phone]   Fax:  [phone]  Name: Tami Stewart MRN: [id number] Date of Birth: 1950/12/25

## 2021-01-07 ENCOUNTER — Ambulatory Visit: Payer: PPO | Admitting: Occupational Therapy

## 2021-01-07 DIAGNOSIS — I890 Lymphedema, not elsewhere classified: Secondary | ICD-10-CM | POA: Diagnosis not present

## 2021-01-07 NOTE — Therapy (Signed)
[place] MAIN [facility] SERVICES [address] [place], [place], [address] Phone: [phone]   Fax:  [phone]  Occupational Therapy Treatment  Patient Details  Name: Tami Stewart MRN: [id number] Date of Birth: [birth year] Referring Provider (OT): Elza Rafter, MD   Encounter Date: 01/07/2021   OT End of Session - 01/07/21 1536    Visit Number 4    Number of Visits 36    Date for OT Re-Evaluation 03/22/21    OT Start Time 0100    OT Stop Time 0212    OT Time Calculation (min) 72 min    Activity Tolerance Patient tolerated treatment well;No increased pain    Behavior During Therapy [facility] for tasks assessed/performed           No past medical history on file.  No past surgical history on file.  There were no vitals filed for this visit.   Subjective Assessment - 01/07/21 1312    Subjective  Tami Stewart presents for OT visit 4/36 to address BLE lymphedema. Pt presents wearing LLE knee length gradient compression wraps applied by her spouse during visit intrerval.    Pertinent History chronic phlebolymphedema, lipodermatosclerosis, varicose vein disease, Dopler + for reflux, endovenous thermal ablation followed by chemical ablation, OSA (uses CPAP)    Limitations decreased standing and waling tolerance, chronic leg pain and swelling,    Repetition Increases Symptoms    Special Tests + Stemmer base of toes bilaterally w/ deep creases at base of toes    Patient Stated Goals reduce leg swelling ; limit progression                        OT Treatments/Exercises (OP) - 01/07/21 0001      ADLs   ADL Education Given Yes                  OT Education - 01/07/21 1535    Education Details Pt edu for lymphatic pumping ther ex, simple self MLD and gradient compression wraps. Good return.    Person(s) Educated Patient    Methods Explanation;Demonstration;Handout    Comprehension Verbalized understanding;Returned  demonstration               OT Long Term Goals - 12/23/20 1246      OT LONG TERM GOAL #1   Title Pt will be able to apply multi-layer, short stretch compression wraps using correct gradient techniques with extra time (modified assist)  to return affected limb to premorbid size and shape, to limit leg pain and infection risk, and to improve safe functional ambulation and mobility.    Baseline dependent    Time 4    Period Days    Status New    Target Date --   4th OT Rx visit     OT LONG TERM GOAL #2   Title Patient will demonstrate understanding of LE precautions, prevention strategies and cellulitis signs and symptoms by verbalizing at least 5 examples using a printed reference (modified independence) to limit infection risk and LE progression.    Baseline dependent    Time 4    Period Days    Status New    Target Date --   4th OT Rx visit     OT LONG TERM GOAL #3   Title Pt will achieve no less than 10% limb volume reductions bilaterally below the knees (ankle to tibial tuberosity = A-D) during Intensive Phase CDT to  achieve optimal limb volume reduction and to prevent re-accumulation of lymphatic congestion and fibrosis in the leg. limit infection risk, to improve functional ambulation and transfers, to improve functional performance of basic and instrumental ADLs, and to limit LE progression.    Baseline dependent    Time 12    Period Weeks    Status New    Target Date 03/23/21      OT LONG TERM GOAL #4   Title Pt will demonstrate and sustain a least 85% compliance performing all daily LE self-care home program components daily throughout Intensive Phase CDT daily, including recommended skin care regime, lymphatic pumping ther ex, 23/7 compression wraps and simple self-MLD, to ensure optimal limb volume reduction, to limit infection risk and to limit LE progression.    Baseline dependent    Time 12    Period Weeks    Status New    Target Date 03/23/21      OT LONG TERM  GOAL #5   Title Using assistive devices (modified independence) Pt will be able to don and doff appropriate daytime compression garments and HOS devices to limit lymphatic re-accumulation and LE progression.    Baseline Max A    Time 12    Period Weeks    Status New    Target Date 03/23/21                 Plan - 01/07/21 1537    Clinical Impression Statement Pt tolerated MLD, skin care and compression wraps without increased pain. Pt demeonstrated excellent return during into level edu for lymphatic pumping therex and neck sequence for MLD w J stroke. Cont as per POC.    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;ADL;Decreased knowledge of use of DME;Pain;Scar mobility;Flexibility;Decreased knowledge of precautions;Skin integrity    Rehab Potential Good    Clinical Decision Making Several treatment options, min-mod task modification necessary    Comorbidities Affecting Occupational Performance: Presence of comorbidities impacting occupational performance    Comorbidities impacting occupational performance description: varicose vein disease, CVI    Modification or Assistance to Complete Evaluation  Min-Moderate modification of tasks or assist with assess necessary to complete eval    OT Frequency 2x / week    OT Duration 12 weeks    OT Treatment/Interventions Self-care/ADL training;DME and/or AE instruction;Manual lymph drainage;Compression bandaging;Therapeutic exercise;Other (comment);Manual Therapy;Patient/family education   skin care w low ph lotion and castor oil to improve skin hydration and mobility'   Plan BLE complete decongestive therapy  (CDT) on 1 leg at a time to limit falls risk. To include MLD, compression bandaging and garments, skin care, ther ex Pt self care edu    Recommended Other Services fit with custom, flat knit compression stockings. Consider thigh highs as tolerated.    Consulted and Agree with Plan of Care Patient           Patient will benefit from skilled therapeutic intervention in order to improve the following deficits and impairments:   Body Structure / Function / Physical Skills: Edema,ADL,Decreased knowledge of use of DME,Pain,Scar mobility,Flexibility,Decreased knowledge of precautions,Skin integrity       Visit Diagnosis: Lymphedema, not elsewhere classified    Problem List There are no problems to display for  this patient.   Andrey Spearman, MS, OTR/L, [facility] 01/07/21 3:40 PM  [place] MAIN [facility] SERVICES [address] [place], [place], [address] Phone: [phone]   Fax:  [phone]  Name: Tami Stewart MRN: [id number] Date of Birth: September 20, 1951

## 2021-01-11 ENCOUNTER — Other Ambulatory Visit: Payer: Self-pay

## 2021-01-11 ENCOUNTER — Ambulatory Visit: Payer: PPO | Admitting: Occupational Therapy

## 2021-01-11 DIAGNOSIS — I890 Lymphedema, not elsewhere classified: Secondary | ICD-10-CM

## 2021-01-12 NOTE — Therapy (Signed)
[place] MAIN [facility] SERVICES [address] [place], [place], [address] Phone: [phone]   Fax:  [phone]  Occupational Therapy Treatment  Patient Details  Name: Tami Stewart MRN: [id number] Date of Birth: [birth year] Referring Provider (OT): Elza Rafter, MD   Encounter Date: 01/11/2021   OT End of Session - 01/12/21 1447    Visit Number 5    Number of Visits 36    Date for OT Re-Evaluation 03/22/21    OT Start Time 0100    OT Stop Time 0205    OT Time Calculation (min) 65 min    Activity Tolerance Patient tolerated treatment well;No increased pain    Behavior During Therapy [facility] for tasks assessed/performed           No past medical history on file.  No past surgical history on file.  There were no vitals filed for this visit.   Subjective Assessment - 01/11/21 1747    Subjective  Tami Stewart presents for OT visit 5/36 to address BLE lymphedema. Pt presents wearing LLE knee length gradient compression wraps in place. She denies leg pain today. She tells me she practiced self MLD during session interval. Pt has no new complaints today    Pertinent History chronic phlebolymphedema, lipodermatosclerosis, varicose vein disease, Dopler + for reflux, endovenous thermal ablation followed by chemical ablation, OSA (uses CPAP)    Limitations decreased standing and waling tolerance, chronic leg pain and swelling,    Repetition Increases Symptoms    Special Tests + Stemmer base of toes bilaterally w/ deep creases at base of toes    Patient Stated Goals reduce leg swelling ; limit progression                        OT Treatments/Exercises (OP) - 01/12/21 1749      ADLs   ADL Education Given Yes      Manual Therapy   Manual Therapy Edema management;Soft tissue mobilization;Manual Lymphatic Drainage (MLD);Compression Bandaging    Manual therapy comments skin care with low ph lotion simultaneously w MLD to improve  hydration and increase tissue mobility.    Soft tissue mobilization fibrosis tecniques integrated with MLD. Good tolerance. No pain.    Manual Lymphatic Drainage (MLD) MLD to LLE utilizing short neck sequence, deep abdominal breathing, functional inguinal LN, and proximal to distal J stroke sequences from thigh to foot. Excellent tolerance.    Compression Bandaging knee length compression wrap using short stretch bandages, 8,10, and 12 cm, over Rosidal foam and stockett in gradient configuration                  OT Education - 01/11/21 1751    Education Details Continued skilled Pt/caregiver education  And LE ADL training throughout visit for lymphedema self care/ home program, including compression wrapping, compression garment and device wear/care, lymphatic pumping ther ex, simple self-MLD, and skin care. Discussed progress towards goals.    Person(s) Educated Patient    Methods Explanation;Demonstration;Handout    Comprehension Verbalized understanding;Returned demonstration               OT Long Term Goals - 12/23/20 1246      OT LONG TERM GOAL #1   Title Pt will be able to apply multi-layer, short stretch compression wraps using correct gradient techniques with extra time (modified assist)  to return affected limb to premorbid size and shape, to limit leg pain and infection risk, and to  improve safe functional ambulation and mobility.    Baseline dependent    Time 4    Period Days    Status New    Target Date --   4th OT Rx visit     OT LONG TERM GOAL #2   Title Patient will demonstrate understanding of LE precautions, prevention strategies and cellulitis signs and symptoms by verbalizing at least 5 examples using a printed reference (modified independence) to limit infection risk and LE progression.    Baseline dependent    Time 4    Period Days    Status New    Target Date --   4th OT Rx visit     OT LONG TERM GOAL #3   Title Pt will achieve no less than 10%  limb volume reductions bilaterally below the knees (ankle to tibial tuberosity = A-D) during Intensive Phase CDT to achieve optimal limb volume reduction and to prevent re-accumulation of lymphatic congestion and fibrosis in the leg. limit infection risk, to improve functional ambulation and transfers, to improve functional performance of basic and instrumental ADLs, and to limit LE progression.    Baseline dependent    Time 12    Period Weeks    Status New    Target Date 03/23/21      OT LONG TERM GOAL #4   Title Pt will demonstrate and sustain a least 85% compliance performing all daily LE self-care home program components daily throughout Intensive Phase CDT daily, including recommended skin care regime, lymphatic pumping ther ex, 23/7 compression wraps and simple self-MLD, to ensure optimal limb volume reduction, to limit infection risk and to limit LE progression.    Baseline dependent    Time 12    Period Weeks    Status New    Target Date 03/23/21      OT LONG TERM GOAL #5   Title Using assistive devices (modified independence) Pt will be able to don and doff appropriate daytime compression garments and HOS devices to limit lymphatic re-accumulation and LE progression.    Baseline Max A    Time 12    Period Weeks    Status New    Target Date 03/23/21                 Plan - 01/12/21 1447    Clinical Impression Statement Pt reports she practiced J stroke and neck sequence of simple self-MLD tqaught last session during visit interval , demonstrating increasing compliance with LE self care home program. Limb volume in LLE continues to reduce but at slower rate. Pt is not having pain with reduction. 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 Pt will definitely benefit for HOS device when garments are ordered to reduce further fibrosis formation and improve lymph function during HOS.Pt tolerated MLD. skin care  and compression wraps to LLE without increased pain today. Cont as per POC.    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 Edema;ADL;Decreased knowledge of use of DME;Pain;Scar mobility;Flexibility;Decreased knowledge of precautions;Skin integrity    Rehab Potential Good    Clinical Decision Making Several treatment options, min-mod task modification necessary    Comorbidities Affecting Occupational Performance: Presence  of comorbidities impacting occupational performance    Comorbidities impacting occupational performance description: varicose vein disease, CVI    Modification or Assistance to Complete Evaluation  Min-Moderate modification of tasks or assist with assess necessary to complete eval    OT Frequency 2x / week    OT Duration 12 weeks    OT Treatment/Interventions Self-care/ADL training;DME and/or AE instruction;Manual lymph drainage;Compression bandaging;Therapeutic exercise;Other (comment);Manual Therapy;Patient/family education   skin care w low ph lotion and castor oil to improve skin hydration and mobility'   Plan BLE complete decongestive therapy (CDT) on 1 leg at a time to limit falls risk. To include MLD, compression bandaging and garments, skin care, ther ex Pt self care edu    Recommended Other Services fit with custom, flat knit compression stockings. Consider thigh highs as tolerated.    Consulted and Agree with Plan of Care Patient           Patient will benefit from skilled therapeutic intervention in order to improve the following deficits and impairments:   Body Structure / Function / Physical Skills: Edema,ADL,Decreased knowledge of  use of DME,Pain,Scar mobility,Flexibility,Decreased knowledge of precautions,Skin integrity       Visit Diagnosis: Lymphedema, not elsewhere classified    Problem List There are no problems to display for this patient.   Andrey Spearman, MS, OTR/L, [facility] 01/12/21 5:52 PM   [place] MAIN [facility] SERVICES [address] [place], [place], [address] Phone: [phone]   Fax:  [phone]  Name: Tami Stewart MRN: [id number] Date of Birth: Mar 31, 1951

## 2021-01-13 ENCOUNTER — Ambulatory Visit: Payer: PPO | Admitting: Occupational Therapy

## 2021-01-14 ENCOUNTER — Ambulatory Visit: Payer: PPO | Admitting: Occupational Therapy

## 2021-01-14 ENCOUNTER — Other Ambulatory Visit: Payer: Self-pay

## 2021-01-14 DIAGNOSIS — I890 Lymphedema, not elsewhere classified: Secondary | ICD-10-CM | POA: Diagnosis not present

## 2021-01-14 NOTE — Therapy (Signed)
[place] MAIN [facility] SERVICES [address] [place], [place], [address] Phone: [phone]   Fax:  [phone]  Occupational Therapy Treatment  Patient Details  Name: Tami Stewart MRN: [id number] Date of Birth: Apr 21, 1951 Referring Provider (OT): Elza Rafter, MD   Encounter Date: 01/14/2021   OT End of Session - 01/14/21 1311    Visit Number 6    Number of Visits 36    Date for OT Re-Evaluation 03/22/21    OT Start Time 0103    OT Stop Time 0205    OT Time Calculation (min) 62 min    Activity Tolerance Patient tolerated treatment well;No increased pain    Behavior During Therapy [facility] for tasks assessed/performed           No past medical history on file.  No past surgical history on file.  There were no vitals filed for this visit.   Subjective Assessment - 01/14/21 1310    Subjective  Tami Stewart presents for OT visit 6/36 to address BLE lymphedema. Pt presents wearing LLE knee length gradient compression wraps in place. She denies leg pain today.    Pertinent History chronic phlebolymphedema, lipodermatosclerosis, varicose vein disease, Dopler + for reflux, endovenous thermal ablation followed by chemical ablation, OSA (uses CPAP)    Limitations decreased standing and waling tolerance, chronic leg pain and swelling,    Repetition Increases Symptoms    Special Tests + Stemmer base of toes bilaterally w/ deep creases at base of toes    Patient Stated Goals reduce leg swelling ; limit progression                        OT Treatments/Exercises (OP) - 01/14/21 1726      ADLs   ADL Education Given Yes      Manual Therapy   Manual Therapy Edema management;Soft tissue mobilization;Manual Lymphatic Drainage (MLD);Compression Bandaging    Manual therapy comments skin care with low ph lotion simultaneously w MLD to improve hydration and increase tissue mobility.    Soft tissue mobilization fibrosis tecniques  integrated with MLD. Good tolerance. No pain.    Manual Lymphatic Drainage (MLD) MLD to LLE utilizing short neck sequence, deep abdominal breathing, functional inguinal LN, and proximal to distal J stroke sequences from thigh to foot. Excellent tolerance.    Compression Bandaging knee length compression wrap using short stretch bandages, 8,10, and 12 cm, over Rosidal foam and stockett in gradient configuration                  OT Education - 01/14/21 1727    Education Details Pt edu focused on differences between custom  lat knit and off-the-shelf circular elastic compression garments.  Educated Pt re indications for both and pros and cons of each, and rational for therapist's current recommendations  Educated Pt re estimated costs, measuring and fitting process ,DME vendor's involvement and access to insurance benefits.    Person(s) Educated Patient    Methods Explanation;Demonstration    Comprehension Verbalized understanding;Returned demonstration;Need further instruction               OT Long Term Goals - 12/23/20 1246      OT LONG TERM GOAL #1   Title Pt will be able to apply multi-layer, short stretch compression wraps using correct gradient techniques with extra time (modified assist)  to return affected limb to premorbid size and shape, to limit leg pain and infection risk, and  to improve safe functional ambulation and mobility.    Baseline dependent    Time 4    Period Days    Status New    Target Date --   4th OT Rx visit     OT LONG TERM GOAL #2   Title Patient will demonstrate understanding of LE precautions, prevention strategies and cellulitis signs and symptoms by verbalizing at least 5 examples using a printed reference (modified independence) to limit infection risk and LE progression.    Baseline dependent    Time 4    Period Days    Status New    Target Date --   4th OT Rx visit     OT LONG TERM GOAL #3   Title Pt will achieve no less than 10% limb  volume reductions bilaterally below the knees (ankle to tibial tuberosity = A-D) during Intensive Phase CDT to achieve optimal limb volume reduction and to prevent re-accumulation of lymphatic congestion and fibrosis in the leg. limit infection risk, to improve functional ambulation and transfers, to improve functional performance of basic and instrumental ADLs, and to limit LE progression.    Baseline dependent    Time 12    Period Weeks    Status New    Target Date 03/23/21      OT LONG TERM GOAL #4   Title Pt will demonstrate and sustain a least 85% compliance performing all daily LE self-care home program components daily throughout Intensive Phase CDT daily, including recommended skin care regime, lymphatic pumping ther ex, 23/7 compression wraps and simple self-MLD, to ensure optimal limb volume reduction, to limit infection risk and to limit LE progression.    Baseline dependent    Time 12    Period Weeks    Status New    Target Date 03/23/21      OT LONG TERM GOAL #5   Title Using assistive devices (modified independence) Pt will be able to don and doff appropriate daytime compression garments and HOS devices to limit lymphatic re-accumulation and LE progression.    Baseline Max A    Time 12    Period Weeks    Status New    Target Date 03/23/21                 Plan - 01/14/21 1728    Clinical Impression Statement Pt was interested in all aspects of garment selection and engaged in edu throughout session. Pt continues to demonstrate excellent response to OT for CDT with decreasing swelling and improved tissue integrity in LLE. Cont as per POC.    OT Occupational Profile and History Comprehensive Assessment- Review of records and extensive additional review of physical, cognitive, psychosocial history related to current functional performance    Occupational performance deficits (Please refer to evaluation for details): ADL's;IADL's;Work;Leisure;Social Participation   Nurse, learning disability / Function / Physical Skills Edema;ADL;Decreased knowledge of use of DME;Pain;Scar mobility;Flexibility;Decreased knowledge of precautions;Skin integrity    Rehab Potential Good    Clinical Decision Making Several treatment options, min-mod task modification necessary    Comorbidities Affecting Occupational Performance: Presence of comorbidities impacting occupational performance    Comorbidities impacting occupational performance description: varicose vein disease, CVI    Modification or Assistance to Complete Evaluation  Min-Moderate modification of tasks or assist with assess necessary to complete eval    OT Frequency 2x / week    OT Duration 12 weeks    OT Treatment/Interventions Self-care/ADL training;DME and/or AE instruction;Manual  lymph drainage;Compression bandaging;Therapeutic exercise;Other (comment);Manual Therapy;Patient/family education   skin care w low ph lotion and castor oil to improve skin hydration and mobility'   Plan BLE complete decongestive therapy (CDT) on 1 leg at a time to limit falls risk. To include MLD, compression bandaging and garments, skin care, ther ex Pt self care edu    Recommended Other Services fit with custom, flat knit compression stockings. Consider thigh highs as tolerated.    Consulted and Agree with Plan of Care Patient           Patient will benefit from skilled therapeutic intervention in order to improve the following deficits and impairments:   Body Structure / Function / Physical Skills: Edema,ADL,Decreased knowledge of use of DME,Pain,Scar mobility,Flexibility,Decreased knowledge of precautions,Skin integrity       Visit Diagnosis: Lymphedema, not elsewhere classified    Problem List There are no problems to display for this patient.   Andrey Spearman, MS, OTR/L, [facility] 01/14/21 5:32 PM  [place] MAIN [facility] SERVICES [address] [place], [place], [address] Phone:  [phone]   Fax:  [phone]  Name: Tami Stewart MRN: [id number] Date of Birth: [birth year]

## 2021-01-18 ENCOUNTER — Ambulatory Visit: Payer: PPO | Admitting: Occupational Therapy

## 2021-01-18 ENCOUNTER — Other Ambulatory Visit: Payer: Self-pay

## 2021-01-18 DIAGNOSIS — I890 Lymphedema, not elsewhere classified: Secondary | ICD-10-CM | POA: Diagnosis not present

## 2021-01-18 NOTE — Therapy (Signed)
[place] MAIN [facility] SERVICES [address] [place], [place], [address] Phone: [phone]   Fax:  [phone]  Occupational Therapy Treatment  Patient Details  Name: Tami Stewart MRN: [id number] Date of Birth: 04/27/1951 Referring Provider (OT): Elza Rafter, MD   Encounter Date: 01/18/2021   OT End of Session - 01/18/21 1420    Visit Number 7    Number of Visits 36    Date for OT Re-Evaluation 03/22/21    OT Start Time 0105    OT Stop Time 0205    OT Time Calculation (min) 60 min    Activity Tolerance Patient tolerated treatment well;No increased pain    Behavior During Therapy [facility] for tasks assessed/performed           No past medical history on file.  No past surgical history on file.  There were no vitals filed for this visit.   Subjective Assessment - 01/18/21 1315    Subjective  Tami Stewart presents for OT visit 7/36 to address BLE lymphedema. Pt presents wearing LLE knee length gradient compression wraps in place. She denies leg pain today. "I looked at compression stockings on line after we talked , but I got confused so I stopped"    Pertinent History chronic phlebolymphedema, lipodermatosclerosis, varicose vein disease, Dopler + for reflux, endovenous thermal ablation followed by chemical ablation, OSA (uses CPAP)    Limitations decreased standing and waling tolerance, chronic leg pain and swelling,    Repetition Increases Symptoms    Special Tests + Stemmer base of toes bilaterally w/ deep creases at base of toes    Patient Stated Goals reduce leg swelling ; limit progression                        OT Treatments/Exercises (OP) - 01/18/21 0001      ADLs   ADL Education Given Yes      Manual Therapy   Manual Therapy Edema management;Soft tissue mobilization;Manual Lymphatic Drainage (MLD);Compression Bandaging    Edema Management Anatomical measurements to size off the shelf Juzo   compression  garments. LLE smallest ankle- 26 cm, RLE widest calf- 35 cm; LLE length A-D= 39 cm.    Compression Bandaging Pt borrowed sample Juzo ccl 1 (20-30 mmHg) size 2, short , thigh high on trial basis. No wraps to RLE today.                  OT Education - 01/18/21 1419    Education Details Cont Pt edu re off the shelf, circular knit vs flat knit custom compression garments. Pt able to try several samples in the clinic to compare    Person(s) Educated Patient    Methods Explanation;Demonstration    Comprehension Verbalized understanding;Returned demonstration;Need further instruction               OT Long Term Goals - 12/23/20 1246      OT LONG TERM GOAL #1   Title Pt will be able to apply multi-layer, short stretch compression wraps using correct gradient techniques with extra time (modified assist)  to return affected limb to premorbid size and shape, to limit leg pain and infection risk, and to improve safe functional ambulation and mobility.    Baseline dependent    Time 4    Period Days    Status New    Target Date --   4th OT Rx visit     OT LONG  TERM GOAL #2   Title Patient will demonstrate understanding of LE precautions, prevention strategies and cellulitis signs and symptoms by verbalizing at least 5 examples using a printed reference (modified independence) to limit infection risk and LE progression.    Baseline dependent    Time 4    Period Days    Status New    Target Date --   4th OT Rx visit     OT LONG TERM GOAL #3   Title Pt will achieve no less than 10% limb volume reductions bilaterally below the knees (ankle to tibial tuberosity = A-D) during Intensive Phase CDT to achieve optimal limb volume reduction and to prevent re-accumulation of lymphatic congestion and fibrosis in the leg. limit infection risk, to improve functional ambulation and transfers, to improve functional performance of basic and instrumental ADLs, and to limit LE progression.    Baseline  dependent    Time 12    Period Weeks    Status New    Target Date 03/23/21      OT LONG TERM GOAL #4   Title Pt will demonstrate and sustain a least 85% compliance performing all daily LE self-care home program components daily throughout Intensive Phase CDT daily, including recommended skin care regime, lymphatic pumping ther ex, 23/7 compression wraps and simple self-MLD, to ensure optimal limb volume reduction, to limit infection risk and to limit LE progression.    Baseline dependent    Time 12    Period Weeks    Status New    Target Date 03/23/21      OT LONG TERM GOAL #5   Title Using assistive devices (modified independence) Pt will be able to don and doff appropriate daytime compression garments and HOS devices to limit lymphatic re-accumulation and LE progression.    Baseline Max A    Time 12    Period Weeks    Status New    Target Date 03/23/21                 Plan - 01/18/21 1421    Clinical Impression Statement Completed anatomical measurements of LLE to determine size for Juzo circular knit  , medical grade, off-the-shelf cmpression knee highs. Pt's calf is too large for a size 2, and ankle is too small for size 3 parameters. Length measures short . Pt was able to try on demos for ccl 1 (20-30 mmHg) , size 2 short thigh high, Juzo soft (2001 preferred fabric) and a Juzo Dynamic (3512),  ccl 2 (30-30 mmHg) knee high. Both provided a pretty good fit, despite trade off for fitting perfectly into either size. I wouldnt rule either out, but a longer trial will be helpful for determining containment and comfort. Pt will try the class 1  thigh high in [place]  [employer] until next session. She'll resume compression wraps if she notices swelling increasing, or of legs hurt, feel more tired. Pt has reported 0/10 leg pain in LLE over past couple of visits and we may find a little more compression may be needed for optimal comfort. Cont as per POC.    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 Edema;ADL;Decreased knowledge of use of DME;Pain;Scar mobility;Flexibility;Decreased knowledge of precautions;Skin integrity  Rehab Potential Good    Clinical Decision Making Several treatment options, min-mod task modification necessary    Comorbidities Affecting Occupational Performance: Presence of comorbidities impacting occupational performance    Comorbidities impacting occupational performance description: varicose vein disease, CVI    Modification or Assistance to Complete Evaluation  Min-Moderate modification of tasks or assist with assess necessary to complete eval    OT Frequency 2x / week    OT Duration 12 weeks    OT Treatment/Interventions Self-care/ADL training;DME and/or AE instruction;Manual lymph drainage;Compression bandaging;Therapeutic exercise;Other (comment);Manual Therapy;Patient/family education   skin care w low ph lotion and castor oil to improve skin hydration and mobility'   Plan BLE complete decongestive therapy (CDT) on 1 leg at a time to limit falls risk. To include MLD, compression bandaging and garments, skin care, ther ex Pt self care edu    Recommended Other Services fit with custom, flat knit compression stockings. Consider thigh highs as tolerated.    Consulted and Agree with Plan of Care Patient           Patient will benefit from skilled therapeutic intervention in order to improve the following deficits and impairments:   Body Structure / Function / Physical Skills: Edema,ADL,Decreased knowledge of use of DME,Pain,Scar mobility,Flexibility,Decreased knowledge of precautions,Skin integrity       Visit  Diagnosis: Lymphedema, not elsewhere classified    Problem List There are no problems to display for this patient.   Andrey Spearman, MS, OTR/L, [facility] 01/18/21 2:33 PM   [place] MAIN [facility] SERVICES [address] [place], [place], [address] Phone: [phone]   Fax:  [phone]  Name: Tami Stewart MRN: [id number] Date of Birth: October 11, 1951

## 2021-01-20 ENCOUNTER — Ambulatory Visit: Payer: PPO | Admitting: Occupational Therapy

## 2021-01-21 ENCOUNTER — Other Ambulatory Visit: Payer: Self-pay

## 2021-01-21 ENCOUNTER — Ambulatory Visit: Payer: PPO | Attending: Family Medicine | Admitting: Occupational Therapy

## 2021-01-21 DIAGNOSIS — I890 Lymphedema, not elsewhere classified: Secondary | ICD-10-CM | POA: Insufficient documentation

## 2021-01-21 NOTE — Therapy (Signed)
[place] [facility] MAIN [facility] SERVICES [address] [place], [place], [address] Phone: [phone]   Fax:  [phone]  Occupational Therapy Treatment  Patient Details  Name: Tami Stewart MRN: [id number] Date of Birth: 11-27-1951 Referring Provider (OT): Coralie Carpen, MD   Encounter Date: 01/21/2021   OT End of Session - 01/21/21 1640    Visit Number 8    Number of Visits 36    Date for OT Re-Evaluation 03/22/21    OT Start Time 0100    OT Stop Time 0200    OT Time Calculation (min) 60 min    Activity Tolerance Patient tolerated treatment well;No increased pain    Behavior During Therapy [facility] for tasks assessed/performed           No past medical history on file.  No past surgical history on file.  There were no vitals filed for this visit.   Subjective Assessment - 01/21/21 1641    Subjective  Tami Stewart presents for OT visit 8/36 to address BLE lymphedema. Pt presents without LLE knee length gradient compression wraps in place. She denies leg pain today. Pt reports she liked the garment loaned for trial very much . She feels it controlled LLE swelling quite well and would like to get the knee length version.    Pertinent History chronic phlebolymphedema, lipodermatosclerosis, varicose vein disease, Dopler + for reflux, endovenous thermal ablation followed by chemical ablation, OSA (uses CPAP)    Limitations decreased standing and waling tolerance, chronic leg pain and swelling,    Repetition Increases Symptoms    Special Tests + Stemmer base of toes bilaterally w/ deep creases at base of toes    Patient Stated Goals reduce leg swelling ; limit progression                        OT Treatments/Exercises (OP) - 01/21/21 1642      ADLs   ADL Education Given Yes      Manual Therapy   Manual Therapy Edema management;Soft tissue mobilization;Manual Lymphatic Drainage (MLD);Compression Bandaging    Manual therapy comments  skin care with low ph lotion simultaneously w MLD to improve hydration and increase tissue mobility.    Soft tissue mobilization fibrosis techniques-gentle    Manual Lymphatic Drainage (MLD) MLD to LLE utilizing short neck sequence, deep abdominal breathing, functional inguinal LN, and proximal to distal J stroke sequences from thigh to foot. Excellent tolerance.    Compression Bandaging Applied multilayer LLE compression wraps from base of toes to below knee using radient techniques as established. Pt returned botrroweed garment                  OT Education - 01/21/21 1644    Education Details Cont Pt edu re off the shelf, circular knit vs flat knit custom compression garments.Provided printed internet resources for vendor outlets and detailed information on recommended and preferred specifications so she can order on line at home.    Person(s) Educated Patient    Methods Explanation;Demonstration    Comprehension Verbalized understanding;Returned demonstration;Need further instruction               OT Long Term Goals - 12/23/20 1246      OT LONG TERM GOAL #1   Title Pt will be able to apply multi-layer, short stretch compression wraps using correct gradient techniques with extra time (modified assist)  to return affected limb to premorbid size and shape, to  limit leg pain and infection risk, and to improve safe functional ambulation and mobility.    Baseline dependent    Time 4    Period Days    Status New    Target Date --   4th OT Rx visit     OT LONG TERM GOAL #2   Title Patient will demonstrate understanding of LE precautions, prevention strategies and cellulitis signs and symptoms by verbalizing at least 5 examples using a printed reference (modified independence) to limit infection risk and LE progression.    Baseline dependent    Time 4    Period Days    Status New    Target Date --   4th OT Rx visit     OT LONG TERM GOAL #3   Title Pt will achieve no less  than 10% limb volume reductions bilaterally below the knees (ankle to tibial tuberosity = A-D) during Intensive Phase CDT to achieve optimal limb volume reduction and to prevent re-accumulation of lymphatic congestion and fibrosis in the leg. limit infection risk, to improve functional ambulation and transfers, to improve functional performance of basic and instrumental ADLs, and to limit LE progression.    Baseline dependent    Time 12    Period Weeks    Status New    Target Date 03/23/21      OT LONG TERM GOAL #4   Title Pt will demonstrate and sustain a least 85% compliance performing all daily LE self-care home program components daily throughout Intensive Phase CDT daily, including recommended skin care regime, lymphatic pumping ther ex, 23/7 compression wraps and simple self-MLD, to ensure optimal limb volume reduction, to limit infection risk and to limit LE progression.    Baseline dependent    Time 12    Period Weeks    Status New    Target Date 03/23/21      OT LONG TERM GOAL #5   Title Using assistive devices (modified independence) Pt will be able to don and doff appropriate daytime compression garments and HOS devices to limit lymphatic re-accumulation and LE progression.    Baseline Max A    Time 12    Period Weeks    Status New    Target Date 03/23/21                 Plan - 01/21/21 1645    Clinical Impression Statement Pt reports LLE swelling well controlled with ccl 1, circular knit, Juzo SOFT, ccl 1 (20-30 mmHg) compression thigh high. She opts for the knee length garment, despite OT recommendation for the thigh length garment. Provided multiple online resources in print form with specifications so she can order at home. LLE swelling is dramatically  increased below the L knee  today as Pt did not reapply wraps after completeing garment trial. Rebound is actually quite rapid and dramatic. Marland KitchenShe tolerated MLD, skin care and reapplication of copressio wraps without  increased pain. Once LLE garment is fit we'l commence CDT to RLE. 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;ADL;Decreased knowledge of use of DME;Pain;Scar mobility;Flexibility;Decreased knowledge of precautions;Skin integrity    Rehab Potential Good    Clinical Decision Making Several treatment options, min-mod  task modification necessary    Comorbidities Affecting Occupational Performance: Presence of comorbidities impacting occupational performance    Comorbidities impacting occupational performance description: varicose vein disease, CVI    Modification or Assistance to Complete Evaluation  Min-Moderate modification of tasks or assist with assess necessary to complete eval    OT Frequency 2x / week    OT Duration 12 weeks    OT Treatment/Interventions Self-care/ADL training;DME and/or AE instruction;Manual lymph drainage;Compression bandaging;Therapeutic exercise;Other (comment);Manual Therapy;Patient/family education   skin care w low ph lotion and castor oil to improve skin hydration and mobility'   Plan BLE complete decongestive therapy (CDT) on 1 leg at a time to limit falls risk. To include MLD, compression bandaging and garments, skin care, ther ex Pt self care edu    Recommended Other Services fit with custom, flat knit compression stockings. Consider thigh highs as tolerated.    Consulted and Agree with Plan of Care Patient           Patient will benefit from skilled therapeutic intervention in order to improve the following deficits and impairments:   Body Structure / Function / Physical Skills: Edema,ADL,Decreased  knowledge of use of DME,Pain,Scar mobility,Flexibility,Decreased knowledge of precautions,Skin integrity       Visit Diagnosis: Lymphedema, not elsewhere classified    Problem List There are no problems to display for this patient.   Andrey Spearman, MS, OTR/L, [facility] 01/21/21 4:54 PM   [place] MAIN [facility] SERVICES [address] [place], [place], [address] Phone: [phone]   Fax:  [phone]  Name: Tami Stewart MRN: [id number] Date of Birth: 1951-11-17

## 2021-01-25 ENCOUNTER — Other Ambulatory Visit: Payer: Self-pay

## 2021-01-25 ENCOUNTER — Ambulatory Visit: Payer: PPO | Admitting: Occupational Therapy

## 2021-01-25 DIAGNOSIS — I890 Lymphedema, not elsewhere classified: Secondary | ICD-10-CM

## 2021-01-25 NOTE — Therapy (Signed)
[place] MAIN [facility] SERVICES [address] [place], [place], [address] Phone: [phone]   Fax:  [phone]  Occupational Therapy Treatment  Patient Details  Name: Tami Stewart MRN: [id number] Date of Birth: 1951-06-17 Referring Provider (OT): Elza Rafter, MD   Encounter Date: 01/25/2021   OT End of Session - 01/25/21 1310    Visit Number 9    Number of Visits 36    Date for OT Re-Evaluation 03/22/21    OT Start Time 0100    OT Stop Time 0210    OT Time Calculation (min) 70 min    Activity Tolerance Patient tolerated treatment well;No increased pain    Behavior During Therapy [facility] for tasks assessed/performed           No past medical history on file.  No past surgical history on file.  There were no vitals filed for this visit.   Subjective Assessment - 01/25/21 1311    Subjective  Marcelene Weidemann presents for OT visit 9/36 to address BLE lymphedema. Pt presents without LLE knee length gradient compression wraps in place. She denies leg pain today. Pt reports she ordered recommended ots COMPRESSION STOCKINGS LAST tHURSDAY. Pt denies LE related leg pain.    Pertinent History chronic phlebolymphedema, lipodermatosclerosis, varicose vein disease, Dopler + for reflux, endovenous thermal ablation followed by chemical ablation, OSA (uses CPAP)    Limitations decreased standing and waling tolerance, chronic leg pain and swelling,    Repetition Increases Symptoms    Special Tests + Stemmer base of toes bilaterally w/ deep creases at base of toes    Patient Stated Goals reduce leg swelling ; limit progression                        OT Treatments/Exercises (OP) - 01/25/21 1313      ADLs   ADL Education Given Yes      Manual Therapy   Manual Therapy Edema management;Manual Lymphatic Drainage (MLD);Compression Bandaging    Soft tissue mobilization fibrosis techniques-gentle    Manual Lymphatic Drainage (MLD) MLD to LLE  utilizing short neck sequence, deep abdominal breathing, functional inguinal LN, and proximal to distal J stroke sequences from thigh to foot. Excellent tolerance.    Compression Bandaging Applied multilayer LLE compression wraps from base of toes to below knee using radient techniques as established. Pt returned botrroweed garment                  OT Education - 01/25/21 1419    Education Details Cont Pt edu re off the shelf, circular knit vs flat knit custom compression garments.Provided printed internet resources for vendor outlets and detailed information on recommended and preferred specifications so she can order on line at home.    Person(s) Educated Patient    Methods Explanation;Demonstration    Comprehension Verbalized understanding;Returned demonstration;Need further instruction               OT Long Term Goals - 12/23/20 1246      OT LONG TERM GOAL #1   Title Pt will be able to apply multi-layer, short stretch compression wraps using correct gradient techniques with extra time (modified assist)  to return affected limb to premorbid size and shape, to limit leg pain and infection risk, and to improve safe functional ambulation and mobility.    Baseline dependent    Time 4    Period Days    Status New  Target Date --   4th OT Rx visit     OT LONG TERM GOAL #2   Title Patient will demonstrate understanding of LE precautions, prevention strategies and cellulitis signs and symptoms by verbalizing at least 5 examples using a printed reference (modified independence) to limit infection risk and LE progression.    Baseline dependent    Time 4    Period Days    Status New    Target Date --   4th OT Rx visit     OT LONG TERM GOAL #3   Title Pt will achieve no less than 10% limb volume reductions bilaterally below the knees (ankle to tibial tuberosity = A-D) during Intensive Phase CDT to achieve optimal limb volume reduction and to prevent re-accumulation of lymphatic  congestion and fibrosis in the leg. limit infection risk, to improve functional ambulation and transfers, to improve functional performance of basic and instrumental ADLs, and to limit LE progression.    Baseline dependent    Time 12    Period Weeks    Status New    Target Date 03/23/21      OT LONG TERM GOAL #4   Title Pt will demonstrate and sustain a least 85% compliance performing all daily LE self-care home program components daily throughout Intensive Phase CDT daily, including recommended skin care regime, lymphatic pumping ther ex, 23/7 compression wraps and simple self-MLD, to ensure optimal limb volume reduction, to limit infection risk and to limit LE progression.    Baseline dependent    Time 12    Period Weeks    Status New    Target Date 03/23/21      OT LONG TERM GOAL #5   Title Using assistive devices (modified independence) Pt will be able to don and doff appropriate daytime compression garments and HOS devices to limit lymphatic re-accumulation and LE progression.    Baseline Max A    Time 12    Period Weeks    Status New    Target Date 03/23/21                 Plan - 01/25/21 1420    Clinical Impression Statement Pt tolerated MLD, skin care and compression wraps without increased pain. Indurrated skin is palpably more flexible today, but still is not WNL in terms of excursion and tightness. Pt able to perform J stroke and neck sequence    for simple self MLD after skillefd teaching w/ mod A  , including demonstration, verbal and physical cues. Cont as per POC.    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;ADL;Decreased  knowledge of use of DME;Pain;Scar mobility;Flexibility;Decreased knowledge of precautions;Skin integrity    Rehab Potential Good    Clinical Decision Making Several treatment options, min-mod task modification necessary    Comorbidities Affecting Occupational Performance: Presence of comorbidities impacting occupational performance    Comorbidities impacting occupational performance description: varicose vein disease, CVI    Modification or Assistance to Complete Evaluation  Min-Moderate modification of tasks or assist with assess necessary to complete eval    OT Frequency 2x / week    OT Duration 12 weeks    OT Treatment/Interventions Self-care/ADL training;DME and/or AE instruction;Manual lymph drainage;Compression bandaging;Therapeutic exercise;Other (comment);Manual  Therapy;Patient/family education   skin care w low ph lotion and castor oil to improve skin hydration and mobility'   Plan BLE complete decongestive therapy (CDT) on 1 leg at a time to limit falls risk. To include MLD, compression bandaging and garments, skin care, ther ex Pt self care edu    Recommended Other Services fit with custom, flat knit compression stockings. Consider thigh highs as tolerated.    Consulted and Agree with Plan of Care Patient           Patient will benefit from skilled therapeutic intervention in order to improve the following deficits and impairments:   Body Structure / Function / Physical Skills: Edema,ADL,Decreased knowledge of use of DME,Pain,Scar mobility,Flexibility,Decreased knowledge of precautions,Skin integrity       Visit Diagnosis: Lymphedema, not elsewhere classified    Problem List There are no problems to display for this patient.   Andrey Spearman, MS, OTR/L, [facility] 01/25/21 2:24 PM   [place] MAIN [facility] SERVICES [address] [place], [place], [address] Phone: [phone]   Fax:  [phone]  Name: JOAN HERSCHBERGER MRN:  [id number] Date of Birth: 08-07-1951

## 2021-01-27 ENCOUNTER — Ambulatory Visit: Payer: PPO | Admitting: Occupational Therapy

## 2021-01-28 ENCOUNTER — Other Ambulatory Visit: Payer: Self-pay

## 2021-01-28 ENCOUNTER — Ambulatory Visit: Payer: PPO | Admitting: Occupational Therapy

## 2021-01-28 DIAGNOSIS — I890 Lymphedema, not elsewhere classified: Secondary | ICD-10-CM

## 2021-01-28 NOTE — Therapy (Signed)
[place] MAIN [facility] SERVICES [address] [place], [place], [address] Phone: [phone]   Fax:  [phone]  Occupational Therapy Treatment  Patient Details  Name: Tami Stewart MRN: [id number] Date of Birth: 03/24/1951 Referring Provider (OT): Elza Rafter, MD   Encounter Date: 01/28/2021   OT End of Session - 01/28/21 1456    Visit Number 10    Number of Visits 36    Date for OT Re-Evaluation 03/22/21    OT Start Time 0105    OT Stop Time 0205    OT Time Calculation (min) 60 min    Activity Tolerance Patient tolerated treatment well;No increased pain    Behavior During Therapy [facility] for tasks assessed/performed           No past medical history on file.  No past surgical history on file.  There were no vitals filed for this visit.   Subjective Assessment - 01/28/21 1449    Subjective  Tami Stewart presents for OT visit 10/36 to address BLE lymphedema. Pt presents with recommended, LLE , ccl 1 (20-30 mmHg) Juzo SOFT knee length compression stocking in place. She brings RLE stocking for assessment. She states the new stocking feels so good and she likes it aa lot, but without compression wraps her leg feels naked.    Pertinent History chronic phlebolymphedema, lipodermatosclerosis, varicose vein disease, Dopler + for reflux, endovenous thermal ablation followed by chemical ablation, OSA (uses CPAP)    Limitations decreased standing and waling tolerance, chronic leg pain and swelling,    Repetition Increases Symptoms    Special Tests + Stemmer base of toes bilaterally w/ deep creases at base of toes    Patient Stated Goals reduce leg swelling ; limit progression                        OT Treatments/Exercises (OP) - 01/28/21 1453      ADLs   ADL Education Given Yes      Manual Therapy   Manual Therapy Edema management;Manual Lymphatic Drainage (MLD);Compression Bandaging;Other (comment)    Edema Management  compression garment assessment    Soft tissue mobilization fibrosis techniques-gentle    Manual Lymphatic Drainage (MLD) MLD to RLE utilizing short neck sequence, deep abdominal breathing, functional inguinal LN, and proximal to distal J stroke sequences from thigh to foot. Excellent tolerance.    Compression Bandaging Fitted BLE compression garments after MLD                  OT Education - 01/28/21 1455    Education Details Skilled LE self care training for wear and care regimes for custom elastic compression garments fitted today. Provided  training for donning and doffing garments using assistive devices (friction gloves and nylon/tyvek sock).    Person(s) Educated Patient    Methods Explanation;Demonstration    Comprehension Verbalized understanding;Returned demonstration               OT Long Term Goals - 01/28/21 1457      OT LONG TERM GOAL #1   Title Pt will be able to apply multi-layer, short stretch compression wraps using correct gradient techniques with extra time (modified assist)  to return affected limb to premorbid size and shape, to limit leg pain and infection risk, and to improve safe functional ambulation and mobility.    Baseline dependent    Time 4    Period Days    Status Achieved  OT LONG TERM GOAL #2   Title Patient will demonstrate understanding of LE precautions, prevention strategies and cellulitis signs and symptoms by verbalizing at least 5 examples using a printed reference (modified independence) to limit infection risk and LE progression.    Baseline dependent    Time 4    Period Days    Status Achieved      OT LONG TERM GOAL #3   Title Pt will achieve no less than 10% limb volume reductions bilaterally below the knees (ankle to tibial tuberosity = A-D) during Intensive Phase CDT to achieve optimal limb volume reduction and to prevent re-accumulation of lymphatic congestion and fibrosis in the leg. limit infection risk, to improve  functional ambulation and transfers, to improve functional performance of basic and instrumental ADLs, and to limit LE progression.    Baseline dependent    Time 12    Period Weeks    Status Partially Met   [name]  has achieved obvious limb volume reduction below the knee in the LLE, but unable to complete comparative volumetrics today. We will complete LLE measurements next visit   Target Date 03/23/21      OT LONG TERM GOAL #4   Title Pt will demonstrate and sustain a least 85% compliance performing all daily LE self-care home program components daily throughout Intensive Phase CDT daily, including recommended skin care regime, lymphatic pumping ther ex, 23/7 compression wraps and simple self-MLD, to ensure optimal limb volume reduction, to limit infection risk and to limit LE progression.    Baseline dependent    Time 12    Period Weeks    Status Achieved      OT LONG TERM GOAL #5   Title Using assistive devices (modified independence) Pt will be able to don and doff appropriate daytime compression garments and HOS devices to limit lymphatic re-accumulation and LE progression.    Baseline Max A    Time 12    Period Weeks    Status Achieved                 Plan - 01/28/21 1458    Clinical Impression Statement [name] demonstrates progress towards all OT goals for LE care. She has achieved obvious limb volume reduction below the knee in the LLE, but we were unable to complete comparative volumetrics today. We will complete LLE measurements next visit to measure progress towards this goal. Please refer to Long Term Goal section for additional detailed information about progress towards other goals for this course of CDT.OTS compression garments fit  bilaterally and appear to be containing R leg swelling at initial assessment. Pt able to don garments independently after MLD today. We will continue to monitor containment and effectiveness at limiting progression. Cont as per  POC. CDT to RLE commenced today using garment initially on trial basis.   She has achieved obvious limb volume reduction below the knee in the LLE, but we were unable to complete comparative volumetrics today. We will complete LLE measurements next visit to measure progress towards this goal.   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;ADL;Decreased knowledge of use of DME;Pain;Scar mobility;Flexibility;Decreased knowledge of precautions;Skin integrity    Rehab Potential Good    Clinical  Decision Making Several treatment options, min-mod task modification necessary    Comorbidities Affecting Occupational Performance: Presence of comorbidities impacting occupational performance    Comorbidities impacting occupational performance description: varicose vein disease, CVI    Modification or Assistance to Complete Evaluation  Min-Moderate modification of tasks or assist with assess necessary to complete eval    OT Frequency 2x / week    OT Duration 12 weeks    OT Treatment/Interventions Self-care/ADL training;DME and/or AE instruction;Manual lymph drainage;Compression bandaging;Therapeutic exercise;Other (comment);Manual Therapy;Patient/family education   skin care w low ph lotion and castor oil to improve skin hydration and mobility'   Plan BLE complete decongestive therapy (CDT) on 1 leg at a time to limit falls risk. To include MLD, compression bandaging and garments, skin care, ther ex Pt self care edu    Recommended Other Services fit with custom, flat knit compression stockings. Consider thigh highs as tolerated.    Consulted and Agree with Plan of Care Patient            Patient will benefit from skilled therapeutic intervention in order to improve the following deficits and impairments:   Body Structure / Function / Physical Skills: Edema,ADL,Decreased knowledge of use of DME,Pain,Scar mobility,Flexibility,Decreased knowledge of precautions,Skin integrity       Visit Diagnosis: Lymphedema, not elsewhere classified    Problem List There are no problems to display for this patient.   Andrey Spearman, MS, OTR/L, [facility] 01/28/21 4:16 PM  [place] MAIN [facility] SERVICES [address] [place], [place], [address] Phone: [phone]   Fax:  [phone]  Name: ELLIOT SIMONEAUX MRN: [id number] Date of Birth: 02-01-1951

## 2021-02-01 ENCOUNTER — Other Ambulatory Visit: Payer: Self-pay

## 2021-02-01 ENCOUNTER — Ambulatory Visit: Payer: PPO | Admitting: Occupational Therapy

## 2021-02-01 DIAGNOSIS — I890 Lymphedema, not elsewhere classified: Secondary | ICD-10-CM | POA: Diagnosis not present

## 2021-02-02 NOTE — Therapy (Signed)
[place] MAIN [facility] SERVICES [address] [place], [place], [address] Phone: [phone]   Fax:  [phone]  Occupational Therapy Treatment  Patient Details  Name: Tami Stewart MRN: [id number] Date of Birth: 04-Jul-1951 Referring Provider (OT): Elza Rafter, MD   Encounter Date: 02/01/2021   OT End of Session - 02/01/21 1615    Visit Number 11    Number of Visits 36    Date for OT Re-Evaluation 03/22/21    OT Start Time 0103    OT Stop Time 0203    OT Time Calculation (min) 60 min    Activity Tolerance Patient tolerated treatment well;No increased pain    Behavior During Therapy [facility] for tasks assessed/performed           No past medical history on file.  No past surgical history on file.  There were no vitals filed for this visit.   Subjective Assessment - 02/01/21 1311    Subjective  Tami Stewart presents for OT visit 11/36 to address BLE lymphedema. Pt presents with recommended, LLE , ccl 1 (20-30 mmHg) Juzo SOFT knee length compression stocking in place. She brings RLE stocking for assessment. She states the new stocking feels so good and she likes it aa lot, but without compression wraps her leg feels naked.    Pertinent History chronic phlebolymphedema, lipodermatosclerosis, varicose vein disease, Dopler + for reflux, endovenous thermal ablation followed by chemical ablation, OSA (uses CPAP)    Limitations decreased standing and waling tolerance, chronic leg pain and swelling,    Repetition Increases Symptoms    Special Tests + Stemmer base of toes bilaterally w/ deep creases at base of toes    Patient Stated Goals reduce leg swelling ; limit progression                                OT Education - 02/01/21 1615    Education Details Cont Pt edu re off the shelf, circular knit vs flat knit custom compression garments.Provided printed internet resources for vendor outlets and detailed information on  recommended and preferred specifications so she can order on line at home.    Person(s) Educated Patient    Methods Explanation;Demonstration    Comprehension Verbalized understanding;Returned demonstration;Need further instruction               OT Long Term Goals - 01/28/21 1457      OT LONG TERM GOAL #1   Title Pt will be able to apply multi-layer, short stretch compression wraps using correct gradient techniques with extra time (modified assist)  to return affected limb to premorbid size and shape, to limit leg pain and infection risk, and to improve safe functional ambulation and mobility.    Baseline dependent    Time 4    Period Days    Status Achieved      OT LONG TERM GOAL #2   Title Patient will demonstrate understanding of LE precautions, prevention strategies and cellulitis signs and symptoms by verbalizing at least 5 examples using a printed reference (modified independence) to limit infection risk and LE progression.    Baseline dependent    Time 4    Period Days    Status Achieved      OT LONG TERM GOAL #3   Title Pt will achieve no less than 10% limb volume reductions bilaterally below the knees (ankle to tibial tuberosity = A-D) during  Intensive Phase CDT to achieve optimal limb volume reduction and to prevent re-accumulation of lymphatic congestion and fibrosis in the leg. limit infection risk, to improve functional ambulation and transfers, to improve functional performance of basic and instrumental ADLs, and to limit LE progression.    Baseline dependent    Time 12    Period Weeks    Status Partially Met   [name]  has achieved obvious limb volume reduction below the knee in the LLE, but unable to complete comparative volumetrics today. We will complete LLE measurements next visit   Target Date 03/23/21      OT LONG TERM GOAL #4   Title Pt will demonstrate and sustain a least 85% compliance performing all daily LE self-care home program components daily  throughout Intensive Phase CDT daily, including recommended skin care regime, lymphatic pumping ther ex, 23/7 compression wraps and simple self-MLD, to ensure optimal limb volume reduction, to limit infection risk and to limit LE progression.    Baseline dependent    Time 12    Period Weeks    Status Achieved      OT LONG TERM GOAL #5   Title Using assistive devices (modified independence) Pt will be able to don and doff appropriate daytime compression garments and HOS devices to limit lymphatic re-accumulation and LE progression.    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;ADL;Decreased knowledge of use of DME;Pain;Scar mobility;Flexibility;Decreased knowledge of precautions;Skin integrity    Rehab Potential Good    Clinical Decision Making Several treatment options, min-mod task modification necessary    Comorbidities Affecting Occupational Performance: Presence of comorbidities impacting occupational performance    Comorbidities impacting occupational performance description: varicose vein disease, CVI    Modification or Assistance to Complete Evaluation  Min-Moderate modification of tasks or assist with assess necessary to complete eval    OT Frequency 2x / week    OT Duration 12 weeks    OT Treatment/Interventions Self-care/ADL training;DME and/or AE instruction;Manual lymph drainage;Compression bandaging;Therapeutic exercise;Other (comment);Manual Therapy;Patient/family education   skin care w low ph  lotion and castor oil to improve skin hydration and mobility'   Plan BLE complete decongestive therapy (CDT) on 1 leg at a time to limit falls risk. To include MLD, compression bandaging and garments, skin care, ther ex Pt self care edu    Recommended Other Services fit with custom, flat knit compression stockings. Consider thigh highs as tolerated.    Consulted and Agree with Plan of Care Patient           Patient will benefit from skilled therapeutic intervention in order to improve the following deficits and impairments:   Body Structure / Function / Physical Skills: Edema,ADL,Decreased knowledge of use of DME,Pain,Scar mobility,Flexibility,Decreased knowledge of precautions,Skin integrity       Visit Diagnosis: Lymphedema, not elsewhere classified    Problem List There are no problems to display for this patient.   Andrey Spearman, MS, OTR/L, [facility] 02/02/21 10:59  AM  [place] MAIN [facility] SERVICES [address] [place], [place], [address] Phone: [phone]   Fax:  [phone]  Name: LUCA BURSTON MRN: [id number] Date of Birth: [birth year]

## 2021-02-03 ENCOUNTER — Ambulatory Visit: Payer: PPO | Admitting: Occupational Therapy

## 2021-02-04 ENCOUNTER — Ambulatory Visit: Payer: PPO | Admitting: Occupational Therapy

## 2021-02-05 ENCOUNTER — Other Ambulatory Visit: Payer: Self-pay | Admitting: Family Medicine

## 2021-02-05 DIAGNOSIS — Z1231 Encounter for screening mammogram for malignant neoplasm of breast: Secondary | ICD-10-CM

## 2021-02-08 ENCOUNTER — Other Ambulatory Visit: Payer: Self-pay

## 2021-02-08 ENCOUNTER — Ambulatory Visit: Payer: PPO | Admitting: Occupational Therapy

## 2021-02-08 DIAGNOSIS — I890 Lymphedema, not elsewhere classified: Secondary | ICD-10-CM

## 2021-02-08 NOTE — Therapy (Signed)
[place] [facility] MAIN [facility] SERVICES [address] [place], [place], [address] Phone: [phone]   Fax:  [phone]  Occupational Therapy Treatment  Patient Details  Name: Tami Stewart MRN: [id number] Date of Birth: 1951-04-13 Referring Provider (OT): Coralie Carpen, MD   Encounter Date: 02/08/2021   OT End of Session - 02/08/21 1641    Visit Number 12    Number of Visits 36    Date for OT Re-Evaluation 03/22/21    OT Start Time 0120    OT Stop Time 0210    OT Time Calculation (min) 50 min    Activity Tolerance Patient tolerated treatment well;No increased pain    Behavior During Therapy [facility] for tasks assessed/performed           No past medical history on file.  No past surgical history on file.  There were no vitals filed for this visit.   Subjective Assessment - 02/08/21 1654    Subjective  Tami Stewart presents for OT visit 12/36 to address BLE lymphedema. Pt presents with recommended, LLE , ccl 1 (20-30 mmHg) Juzo SOFT knee length compression stocking in place. Pt reprts she has been compliant with compression garments over the weekend. She is interested in exploring the [employer]AX HOS device demonstrated last session. The DME vendor informed us by email that she does indeed have some coverake benefits for lymphedema garments.    Pertinent History chronic phlebolymphedema, lipodermatosclerosis, varicose vein disease, Dopler + for reflux, endovenous thermal ablation followed by chemical ablation, OSA (uses CPAP)    Limitations decreased standing and waling tolerance, chronic leg pain and swelling,    Repetition Increases Symptoms    Special Tests + Stemmer base of toes bilaterally w/ deep creases at base of toes    Patient Stated Goals reduce leg swelling ; limit progression               LYMPHEDEMA/ONCOLOGY QUESTIONNAIRE - 02/08/21 1656      Left Lower Extremity Lymphedema   Other L leg (A-D) volume = 3301.8 ml. L thigh volume  = 3950.0 ml. L full limb volume = 7251.8 ml.    Other L leg volume is decreased by 3.6%. L thigh is increased by 5.7%. L full limv is increased in volume by 1.3% since initially measured on 12/31/20                   OT Treatments/Exercises (OP) - 02/08/21 1655      ADLs   ADL Education Given Yes      Manual Therapy   Manual Therapy Edema management;Manual Lymphatic Drainage (MLD);Compression Bandaging;Other (comment)    Edema Management LLE comparative limb volumetrics                  OT Education - 02/08/21 1659    Education Details Cont Pt edu re off the shelf, circular knit vs flat knit custom compression garments.Provided printed internet resources for vendor outlets and detailed information on recommended and preferred specifications so she can order on line at home.    Person(s) Educated Patient    Methods Explanation;Demonstration    Comprehension Verbalized understanding;Returned demonstration;Need further instruction               OT Long Term Goals - 01/28/21 1457      OT LONG TERM GOAL #1   Title Pt will be able to apply multi-layer, short stretch compression wraps using correct gradient techniques with extra time (modified assist)  to return affected limb to premorbid size and shape, to limit leg pain and infection risk, and to improve safe functional ambulation and mobility.    Baseline dependent    Time 4    Period Days    Status Achieved      OT LONG TERM GOAL #2   Title Patient will demonstrate understanding of LE precautions, prevention strategies and cellulitis signs and symptoms by verbalizing at least 5 examples using a printed reference (modified independence) to limit infection risk and LE progression.    Baseline dependent    Time 4    Period Days    Status Achieved      OT LONG TERM GOAL #3   Title Pt will achieve no less than 10% limb volume reductions bilaterally below the knees (ankle to tibial tuberosity = A-D) during  Intensive Phase CDT to achieve optimal limb volume reduction and to prevent re-accumulation of lymphatic congestion and fibrosis in the leg. limit infection risk, to improve functional ambulation and transfers, to improve functional performance of basic and instrumental ADLs, and to limit LE progression.    Baseline dependent    Time 12    Period Weeks    Status Partially Met   Tami Stewart  has achieved obvious limb volume reduction below the knee in the LLE, but unable to complete comparative volumetrics today. We will complete LLE measurements next visit   Target Date 03/23/21      OT LONG TERM GOAL #4   Title Pt will demonstrate and sustain a least 85% compliance performing all daily LE self-care home program components daily throughout Intensive Phase CDT daily, including recommended skin care regime, lymphatic pumping ther ex, 23/7 compression wraps and simple self-MLD, to ensure optimal limb volume reduction, to limit infection risk and to limit LE progression.    Baseline dependent    Time 12    Period Weeks    Status Achieved      OT LONG TERM GOAL #5   Title Using assistive devices (modified independence) Pt will be able to don and doff appropriate daytime compression garments and HOS devices to limit lymphatic re-accumulation and LE progression.    Baseline Max A    Time 12    Period Weeks    Status Achieved                 Plan - 02/08/21 1646    Clinical Impression Statement L leg and foot below the knee presenting with noticable increase in swelling with ccl 1 knee length stocking in place. RLE is slightly more swollen. LLE comparative limb volumetrics reveal L leg is decreased in volume by 3.6% since initiall measured on 12/31/20, but limb volume was significantly reduced beyond that when we switched to OTS ccl 1 stockings. L thigh is increased in volume by 5.7%, which suggests leg volume has moved proximally and is somewhat stagnant . overall LLE volume   is increased by  1.3% since  commencing CDT. Pt and OT agree to wear garments as directed until next visit this coming Wednesday and we'll discuss moving forward with either a more compressive OTS garment with a denser fabric, or exploring a custom flat knit    garment. DME vendor is assisting with identifying insurance benefits.Volume increases are due to non-compliance with compression,  suffen increase in systemic fluid retention, or poor containment and inadequate comression, or of course, a combination of all 3 factors. Wraps not used today .Cont as per POC.  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;ADL;Decreased knowledge of use of DME;Pain;Scar mobility;Flexibility;Decreased knowledge of precautions;Skin integrity    Rehab Potential Good    Clinical Decision Making Several treatment options, min-mod task modification necessary    Comorbidities Affecting Occupational Performance: Presence of comorbidities impacting occupational performance    Comorbidities impacting occupational performance description: varicose vein disease, CVI    Modification or Assistance to Complete Evaluation  Min-Moderate modification of tasks or assist with assess necessary to complete eval    OT Frequency 2x / week    OT Duration 12 weeks    OT Treatment/Interventions Self-care/ADL training;DME and/or AE instruction;Manual lymph drainage;Compression bandaging;Therapeutic exercise;Other (comment);Manual Therapy;Patient/family education   skin care w low ph lotion and castor oil to improve skin hydration and mobility'   Plan BLE complete decongestive therapy (CDT) on 1 leg at a time to limit falls  risk. To include MLD, compression bandaging and garments, skin care, ther ex Pt self care edu    Recommended Other Services fit with custom, flat knit compression stockings. Consider thigh highs as tolerated.    Consulted and Agree with Plan of Care Patient           Patient will benefit from skilled therapeutic intervention in order to improve the following deficits and impairments:   Body Structure / Function / Physical Skills: Edema,ADL,Decreased knowledge of use of DME,Pain,Scar mobility,Flexibility,Decreased knowledge of precautions,Skin integrity       Visit Diagnosis: Lymphedema, not elsewhere classified    Problem List There are no problems to display for this patient.  Andrey Spearman, MS, OTR/L, [facility] 02/08/21 5:00 PM   [place] MAIN [facility] SERVICES [address] [place], [place], [address] Phone: [phone]   Fax:  [phone]  Name: Tami Stewart MRN: [id number] Date of Birth: 01-09-1951

## 2021-02-10 ENCOUNTER — Other Ambulatory Visit: Payer: Self-pay

## 2021-02-10 ENCOUNTER — Ambulatory Visit: Payer: PPO | Admitting: Occupational Therapy

## 2021-02-10 DIAGNOSIS — I890 Lymphedema, not elsewhere classified: Secondary | ICD-10-CM

## 2021-02-11 ENCOUNTER — Ambulatory Visit: Payer: PPO | Admitting: Occupational Therapy

## 2021-02-11 NOTE — Therapy (Signed)
[place] MAIN [facility] SERVICES [address] [place], [place], [address] Phone: [phone]   Fax:  [phone]  Occupational Therapy Treatment  Patient Details  Name: Tami Stewart MRN: [id number] Date of Birth: Feb 04, 1951 Referring Provider (OT): Elza Rafter, MD   Encounter Date: 02/10/2021   OT End of Session - 02/10/21 1404    Visit Number 13    Number of Visits 36    Date for OT Re-Evaluation 03/22/21    OT Start Time 0201    OT Stop Time 0307    OT Time Calculation (min) 66 min    Activity Tolerance Patient tolerated treatment well;No increased pain    Behavior During Therapy [facility] for tasks assessed/performed           No past medical history on file.  No past surgical history on file.  There were no vitals filed for this visit.   Subjective Assessment - 02/10/21 1406    Subjective  Tami Stewart presents for OT visit 13/36 to address BLE lymphedema. Pt presents with recommended, LLE , ccl 1 (20-30 mmHg) Juzo SOFT knee length compression stocking in place. Ptdenies LE related pain. Discussed benefits for garments based on most recent info from DME vendor.    Pertinent History chronic phlebolymphedema, lipodermatosclerosis, varicose vein disease, Dopler + for reflux, endovenous thermal ablation followed by chemical ablation, OSA (uses CPAP)    Limitations decreased standing and waling tolerance, chronic leg pain and swelling,    Repetition Increases Symptoms    Special Tests + Stemmer base of toes bilaterally w/ deep creases at base of toes    Patient Stated Goals reduce leg swelling ; limit progression                                OT Education - 02/11/21 1203    Education Details Continued Pt/ CG edu for lymphedema self care  and home program throughout session. Topics include multilayer, gradient compression wrapping, simple self-MLD, therapeutic lymphatic pumping exercises, skin/nail care, risk  reduction factors and LE precautions, compression garments/recommendations and wear and care schedule and compression garment donning / doffing using assistive devices. All questions answered to the Pt's satisfaction, and Pt demonstrates understanding by report.    Person(s) Educated Patient    Methods Explanation;Demonstration    Comprehension Verbalized understanding;Returned demonstration;Need further instruction               OT Long Term Goals - 01/28/21 1457      OT LONG TERM GOAL #1   Title Pt will be able to apply multi-layer, short stretch compression wraps using correct gradient techniques with extra time (modified assist)  to return affected limb to premorbid size and shape, to limit leg pain and infection risk, and to improve safe functional ambulation and mobility.    Baseline dependent    Time 4    Period Days    Status Achieved      OT LONG TERM GOAL #2   Title Patient will demonstrate understanding of LE precautions, prevention strategies and cellulitis signs and symptoms by verbalizing at least 5 examples using a printed reference (modified independence) to limit infection risk and LE progression.    Baseline dependent    Time 4    Period Days    Status Achieved      OT LONG TERM GOAL #3   Title Pt will achieve no less than 10%  limb volume reductions bilaterally below the knees (ankle to tibial tuberosity = A-D) during Intensive Phase CDT to achieve optimal limb volume reduction and to prevent re-accumulation of lymphatic congestion and fibrosis in the leg. limit infection risk, to improve functional ambulation and transfers, to improve functional performance of basic and instrumental ADLs, and to limit LE progression.    Baseline dependent    Time 12    Period Weeks    Status Partially Met   Tami Stewart  has achieved obvious limb volume reduction below the knee in the LLE, but unable to complete comparative volumetrics today. We will complete LLE measurements next  visit   Target Date 03/23/21      OT LONG TERM GOAL #4   Title Pt will demonstrate and sustain a least 85% compliance performing all daily LE self-care home program components daily throughout Intensive Phase CDT daily, including recommended skin care regime, lymphatic pumping ther ex, 23/7 compression wraps and simple self-MLD, to ensure optimal limb volume reduction, to limit infection risk and to limit LE progression.    Baseline dependent    Time 12    Period Weeks    Status Achieved      OT LONG TERM GOAL #5   Title Using assistive devices (modified independence) Pt will be able to don and doff appropriate daytime compression garments and HOS devices to limit lymphatic re-accumulation and LE progression.    Baseline Max A    Time 12    Period Weeks    Status Achieved                 Plan - 02/11/21 1156    Clinical Impression Statement L leg and foot swelling coninues today since last visit. RLE appears stable for swelling with ccl 1 (20-30 mmHg) compression stocking. Pt verbalized understanding of need to replace the existing OTS ccl 1 stocking with one providing more compression and/ or containment. We discussed the following options: 1-Her insurance benefits provide coverage for a custom, flat knit garment providing increased containment. 2- She could replace the existing class 1 garment with a like fabric in ccl 2 (30-40 mmHg), or 3- she could try another fabric style of OTS stocking using Juzo Dynamic , which is slightly denser fabric and less stretchy. Pt will consider and discuss with her spouse and we'll finalize the decision next visit. Pt tolerated MLD , skin care and compression to LLE  today without pain. We'll resume comrpession wrap while waiting for L garment replacement.  Cont as per POC.    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 Edema;ADL;Decreased knowledge of use of DME;Pain;Scar mobility;Flexibility;Decreased knowledge of precautions;Skin integrity    Rehab Potential Good    Clinical Decision Making Several treatment options, min-mod task modification necessary    Comorbidities Affecting Occupational Performance: Presence of comorbidities impacting occupational performance    Comorbidities impacting occupational performance description: varicose vein disease, CVI    Modification or Assistance to Complete Evaluation  Min-Moderate modification of tasks or assist with assess necessary to complete eval    OT Frequency 2x / week    OT Duration 12 weeks    OT Treatment/Interventions Self-care/ADL training;DME and/or AE instruction;Manual lymph drainage;Compression bandaging;Therapeutic exercise;Other (comment);Manual Therapy;Patient/family education  skin care w low ph lotion and castor oil to improve skin hydration and mobility'   Plan BLE complete decongestive therapy (CDT) on 1 leg at a time to limit falls risk. To include MLD, compression bandaging and garments, skin care, ther ex Pt self care edu    Recommended Other Services fit with custom, flat knit compression stockings. Consider thigh highs as tolerated.    Consulted and Agree with Plan of Care Patient           Patient will benefit from skilled therapeutic intervention in order to improve the following deficits and impairments:   Body Structure / Function / Physical Skills: Edema,ADL,Decreased knowledge of use of DME,Pain,Scar mobility,Flexibility,Decreased knowledge of precautions,Skin integrity       Visit Diagnosis: Lymphedema, not elsewhere classified    Problem List There are no problems to display for this patient.  Andrey Spearman, MS,  OTR/L, [facility] 02/11/21 12:08 PM  [place] MAIN [facility] SERVICES [address] [place], [place], [address] Phone: [phone]   Fax:  [phone]  Name: Tami Stewart MRN: [id number] Date of Birth: Mar 16, 1951

## 2021-02-15 ENCOUNTER — Other Ambulatory Visit: Payer: Self-pay

## 2021-02-15 ENCOUNTER — Ambulatory Visit: Payer: PPO | Admitting: Occupational Therapy

## 2021-02-15 DIAGNOSIS — I890 Lymphedema, not elsewhere classified: Secondary | ICD-10-CM | POA: Diagnosis not present

## 2021-02-15 NOTE — Therapy (Signed)
[place] MAIN [facility] SERVICES [address] [place], [place], [address] Phone: [phone]   Fax:  [phone]  Occupational Therapy Treatment  Patient Details  Name: Tami Stewart MRN: [id number] Date of Birth: 08/09/1951 Referring Provider (OT): Elza Rafter, MD   Encounter Date: 02/15/2021   OT End of Session - 02/15/21 1632    Visit Number 14    Number of Visits 36    Date for OT Re-Evaluation 03/22/21    OT Start Time 0103    OT Stop Time 0210    OT Time Calculation (min) 67 min    Activity Tolerance Patient tolerated treatment well;No increased pain    Behavior During Therapy [facility] for tasks assessed/performed           No past medical history on file.  No past surgical history on file.  There were no vitals filed for this visit.   Subjective Assessment - 02/15/21 1639    Subjective  Calissa Swenor presents for OT visit 14/36 to address BLE lymphedema. Pt presents with loaned and trialed LLE , ccl 2 (30-40 mmHg) Juzo DYNAMIC  knee length compression stocking in place. Pt denies LE related pain.Pt reports that she liked tyhe dynamic fabric, but found if difficult to doff.    Pertinent History chronic phlebolymphedema, lipodermatosclerosis, varicose vein disease, Dopler + for reflux, endovenous thermal ablation followed by chemical ablation, OSA (uses CPAP)    Limitations decreased standing and waling tolerance, chronic leg pain and swelling,    Repetition Increases Symptoms    Special Tests + Stemmer base of toes bilaterally w/ deep creases at base of toes    Patient Stated Goals reduce leg swelling ; limit progression                        OT Treatments/Exercises (OP) - 02/15/21 1641      ADLs   ADL Education Given Yes      Manual Therapy   Manual Therapy Edema management;Manual Lymphatic Drainage (MLD);Compression Bandaging;Other (comment)    Edema Management trial compression garment assessment    Soft  tissue mobilization fibrosis techniques-gentle    Manual Lymphatic Drainage (MLD) MLD to RLE utilizing short neck sequence, deep abdominal breathing, functional inguinal LN, and proximal to distal J stroke sequences from thigh to foot. Excellent tolerance.    Compression Bandaging Fitted BLE compression garments after MLD                       OT Long Term Goals - 01/28/21 1457      OT LONG TERM GOAL #1   Title Pt will be able to apply multi-layer, short stretch compression wraps using correct gradient techniques with extra time (modified assist)  to return affected limb to premorbid size and shape, to limit leg pain and infection risk, and to improve safe functional ambulation and mobility.    Baseline dependent    Time 4    Period Days    Status Achieved      OT LONG TERM GOAL #2   Title Patient will demonstrate understanding of LE precautions, prevention strategies and cellulitis signs and symptoms by verbalizing at least 5 examples using a printed reference (modified independence) to limit infection risk and LE progression.    Baseline dependent    Time 4    Period Days    Status Achieved      OT LONG TERM GOAL #3  Title Pt will achieve no less than 10% limb volume reductions bilaterally below the knees (ankle to tibial tuberosity = A-D) during Intensive Phase CDT to achieve optimal limb volume reduction and to prevent re-accumulation of lymphatic congestion and fibrosis in the leg. limit infection risk, to improve functional ambulation and transfers, to improve functional performance of basic and instrumental ADLs, and to limit LE progression.    Baseline dependent    Time 12    Period Weeks    Status Partially Met   Mrs Chowning  has achieved obvious limb volume reduction below the knee in the LLE, but unable to complete comparative volumetrics today. We will complete LLE measurements next visit   Target Date 03/23/21      OT LONG TERM GOAL #4   Title Pt will  demonstrate and sustain a least 85% compliance performing all daily LE self-care home program components daily throughout Intensive Phase CDT daily, including recommended skin care regime, lymphatic pumping ther ex, 23/7 compression wraps and simple self-MLD, to ensure optimal limb volume reduction, to limit infection risk and to limit LE progression.    Baseline dependent    Time 12    Period Weeks    Status Achieved      OT LONG TERM GOAL #5   Title Using assistive devices (modified independence) Pt will be able to don and doff appropriate daytime compression garments and HOS devices to limit lymphatic re-accumulation and LE progression.    Baseline Max A    Time 12    Period Weeks    Status Achieved                 Plan - 02/15/21 1633    Clinical Impression Statement LLE swelling is improved in the leg with denser Juzo Dynamic fabric in leaned stocking, byt L foot swelling is noticably increased today. Foot portion of garment appears to fit well when fabric is properly distributed from heel to knee, although there is some evidence of positive indentation around the ankle, pointing to bunching earlier in the day. We discussed garment options and recommendations going forward, including custom compression stocking on the L only with custom toe cap after initial trial with custom garment. Pt like [employer] SOFT fabric better. Although this fabric is most likely adequate for containment, Pt verbalizes that this is unprovedn We can however, change garment fabric selection if it is tried, but does not hold swelling down. Pt will discuss with spouse as custom garments are very costly, and we'll continue to move in the direction of completing measurements annd replacinf off the shelf garment ASAP.  Cont as per POC.    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 Edema;ADL;Decreased knowledge of use of DME;Pain;Scar mobility;Flexibility;Decreased knowledge of precautions;Skin integrity    Rehab Potential Good    Clinical Decision Making Several treatment options, min-mod task modification necessary    Comorbidities Affecting Occupational Performance: Presence of comorbidities impacting occupational performance    Comorbidities impacting occupational performance description: varicose vein disease, CVI    Modification or Assistance to Complete Evaluation  Min-Moderate modification of tasks or assist with assess necessary to complete eval    OT Frequency 2x / week    OT Duration 12 weeks  OT Treatment/Interventions Self-care/ADL training;DME and/or AE instruction;Manual lymph drainage;Compression bandaging;Therapeutic exercise;Other (comment);Manual Therapy;Patient/family education   skin care w low ph lotion and castor oil to improve skin hydration and mobility'   Plan BLE complete decongestive therapy (CDT) on 1 leg at a time to limit falls risk. To include MLD, compression bandaging and garments, skin care, ther ex Pt self care edu    Recommended Other Services fit with custom, flat knit compression stockings. Consider thigh highs as tolerated.    Consulted and Agree with Plan of Care Patient           Patient will benefit from skilled therapeutic intervention in order to improve the following deficits and impairments:   Body Structure / Function / Physical Skills: Edema,ADL,Decreased knowledge of use of DME,Pain,Scar mobility,Flexibility,Decreased knowledge of precautions,Skin integrity       Visit Diagnosis: Lymphedema, not elsewhere classified    Problem List There are no problems to display for this patient.   Andrey Spearman, MS,  OTR/L, [facility] 02/15/21 4:43 PM  [place] MAIN [facility] SERVICES [address] [place], [place], [address] Phone: [phone]   Fax:  [phone]  Name: LINDSEA OLIVAR MRN: [id number] Date of Birth: 06/07/1951

## 2021-02-17 ENCOUNTER — Ambulatory Visit: Payer: PPO | Admitting: Occupational Therapy

## 2021-02-18 ENCOUNTER — Other Ambulatory Visit: Payer: Self-pay

## 2021-02-18 ENCOUNTER — Ambulatory Visit: Payer: PPO | Attending: Family Medicine | Admitting: Occupational Therapy

## 2021-02-18 DIAGNOSIS — I890 Lymphedema, not elsewhere classified: Secondary | ICD-10-CM | POA: Diagnosis not present

## 2021-02-18 NOTE — Therapy (Signed)
[place] MAIN [facility] SERVICES [address] [place], [place], [address] Phone: [phone]   Fax:  [phone]  Occupational Therapy Treatment  Patient Details  Name: Tami Stewart MRN: [id number] Date of Birth: 04-17-1951 Referring Provider (OT): Elza Rafter, MD   Encounter Date: 02/18/2021   OT End of Session - 02/18/21 1314    Visit Number 15    Number of Visits 36    Date for OT Re-Evaluation 03/22/21    OT Start Time 0103    OT Stop Time 0210    OT Time Calculation (min) 67 min    Activity Tolerance Patient tolerated treatment well;No increased pain    Behavior During Therapy [facility] for tasks assessed/performed           No past medical history on file.  No past surgical history on file.  There were no vitals filed for this visit.   Subjective Assessment - 02/18/21 1359    Subjective  Tami Stewart presents for OT visit 15/36 to address BLE lymphedema. Pt presents with loaned and trialed LLE , ccl 2 (30-40 mmHg) Juzo DYNAMIC  knee length compression stocking in place. Pt denies le related pain.    Pertinent History chronic phlebolymphedema, lipodermatosclerosis, varicose vein disease, Dopler + for reflux, endovenous thermal ablation followed by chemical ablation, OSA (uses CPAP)    Limitations decreased standing and waling tolerance, chronic leg pain and swelling,    Repetition Increases Symptoms    Special Tests + Stemmer base of toes bilaterally w/ deep creases at base of toes    Patient Stated Goals reduce leg swelling ; limit progression                        OT Treatments/Exercises (OP) - 02/18/21 1420      ADLs   ADL Education Given Yes      Manual Therapy   Manual Therapy Edema management;Manual Lymphatic Drainage (MLD);Compression Bandaging;Other (comment)    Soft tissue mobilization fibrosis techniques-gentle    Manual Lymphatic Drainage (MLD) MLD to LLE utilizing short neck sequence, deep  abdominal breathing, functional inguinal LN, and proximal to distal J stroke sequences from thigh to foot. Excellent tolerance.    Compression Bandaging Pt donned compression garments her self using friction gloves at end of session.                  OT Education - 02/18/21 1419    Education Details Continued Pt/ CG edu for lymphedema self care  and home program throughout session. Topics include multilayer, gradient compression wrapping, simple self-MLD, therapeutic lymphatic pumping exercises, skin/nail care, risk reduction factors and LE precautions, compression garments/recommendations and wear and care schedule and compression garment donning / doffing using assistive devices. All questions answered to the Pt's satisfaction, and Pt demonstrates understanding by report.    Person(s) Educated Patient    Methods Explanation;Demonstration    Comprehension Verbalized understanding;Returned demonstration;Need further instruction               OT Long Term Goals - 01/28/21 1457      OT LONG TERM GOAL #1   Title Pt will be able to apply multi-layer, short stretch compression wraps using correct gradient techniques with extra time (modified assist)  to return affected limb to premorbid size and shape, to limit leg pain and infection risk, and to improve safe functional ambulation and mobility.    Baseline dependent    Time 4  Period Days    Status Achieved      OT LONG TERM GOAL #2   Title Patient will demonstrate understanding of LE precautions, prevention strategies and cellulitis signs and symptoms by verbalizing at least 5 examples using a printed reference (modified independence) to limit infection risk and LE progression.    Baseline dependent    Time 4    Period Days    Status Achieved      OT LONG TERM GOAL #3   Title Pt will achieve no less than 10% limb volume reductions bilaterally below the knees (ankle to tibial tuberosity = A-D) during Intensive Phase CDT to  achieve optimal limb volume reduction and to prevent re-accumulation of lymphatic congestion and fibrosis in the leg. limit infection risk, to improve functional ambulation and transfers, to improve functional performance of basic and instrumental ADLs, and to limit LE progression.    Baseline dependent    Time 12    Period Weeks    Status Partially Met   Tami Stewart  has achieved obvious limb volume reduction below the knee in the LLE, but unable to complete comparative volumetrics today. We will complete LLE measurements next visit   Target Date 03/23/21      OT LONG TERM GOAL #4   Title Pt will demonstrate and sustain a least 85% compliance performing all daily LE self-care home program components daily throughout Intensive Phase CDT daily, including recommended skin care regime, lymphatic pumping ther ex, 23/7 compression wraps and simple self-MLD, to ensure optimal limb volume reduction, to limit infection risk and to limit LE progression.    Baseline dependent    Time 12    Period Weeks    Status Achieved      OT LONG TERM GOAL #5   Title Using assistive devices (modified independence) Pt will be able to don and doff appropriate daytime compression garments and HOS devices to limit lymphatic re-accumulation and LE progression.    Baseline Max A    Time 12    Period Weeks    Status Achieved                 Plan - 02/18/21 1415    Clinical Impression Statement Excellent tolerance for LLE MLD, simultaneous skin care and compression garments after manual session. Pt in agreement with plan to increase compression class and fabric containment  for LLE compression stocking to improve control of swelling in more clinically involved limb. Pt will continue wearing lighter weight Juzo SOFT, ccl 1 ( 20-30 mmHg) on the RLE. Pt will order replacement for the L: Juzo DYNAMIC, ccl 2 (30-40 mmHg) , size !!!, short tonight. Cont as per POC.    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 / Physical Skills Edema;ADL;Decreased knowledge of use of DME;Pain;Scar mobility;Flexibility;Decreased knowledge of precautions;Skin integrity    Rehab Potential Good    Clinical Decision Making Several treatment options, min-mod task modification necessary    Comorbidities Affecting Occupational Performance: Presence of comorbidities impacting occupational performance    Comorbidities impacting occupational performance description: varicose vein disease, CVI    Modification or Assistance to Complete Evaluation  Min-Moderate modification of tasks or assist with assess necessary to complete eval    OT Frequency 2x /  week    OT Duration 12 weeks    OT Treatment/Interventions Self-care/ADL training;DME and/or AE instruction;Manual lymph drainage;Compression bandaging;Therapeutic exercise;Other (comment);Manual Therapy;Patient/family education   skin care w low ph lotion and castor oil to improve skin hydration and mobility'   Plan BLE complete decongestive therapy (CDT) on 1 leg at a time to limit falls risk. To include MLD, compression bandaging and garments, skin care, ther ex Pt self care edu    Recommended Other Services fit with custom, flat knit compression stockings. Consider thigh highs as tolerated.    Consulted and Agree with Plan of Care Patient           Patient will benefit from skilled therapeutic intervention in order to improve the following deficits and impairments:   Body Structure / Function / Physical Skills: Edema,ADL,Decreased knowledge of use of DME,Pain,Scar mobility,Flexibility,Decreased knowledge of precautions,Skin integrity       Visit  Diagnosis: Lymphedema, not elsewhere classified    Problem List There are no problems to display for this patient.  Andrey Spearman, MS, OTR/L, [facility] 02/18/21 2:22 PM   [place] MAIN [facility] SERVICES [address] [place], [place], [address] Phone: [phone]   Fax:  [phone]  Name: Tami Stewart MRN: [id number] Date of Birth: 1951-05-14

## 2021-02-19 DIAGNOSIS — J309 Allergic rhinitis, unspecified: Secondary | ICD-10-CM | POA: Diagnosis not present

## 2021-02-19 DIAGNOSIS — N952 Postmenopausal atrophic vaginitis: Secondary | ICD-10-CM | POA: Diagnosis not present

## 2021-02-19 DIAGNOSIS — M899 Disorder of bone, unspecified: Secondary | ICD-10-CM | POA: Diagnosis not present

## 2021-02-19 DIAGNOSIS — I10 Essential (primary) hypertension: Secondary | ICD-10-CM | POA: Diagnosis not present

## 2021-02-19 DIAGNOSIS — Z85828 Personal history of other malignant neoplasm of skin: Secondary | ICD-10-CM | POA: Diagnosis not present

## 2021-02-19 DIAGNOSIS — G4733 Obstructive sleep apnea (adult) (pediatric): Secondary | ICD-10-CM | POA: Diagnosis not present

## 2021-02-19 DIAGNOSIS — Z0000 Encounter for general adult medical examination without abnormal findings: Secondary | ICD-10-CM | POA: Diagnosis not present

## 2021-02-19 DIAGNOSIS — I890 Lymphedema, not elsewhere classified: Secondary | ICD-10-CM | POA: Diagnosis not present

## 2021-02-19 DIAGNOSIS — E782 Mixed hyperlipidemia: Secondary | ICD-10-CM | POA: Diagnosis not present

## 2021-02-19 DIAGNOSIS — F411 Generalized anxiety disorder: Secondary | ICD-10-CM | POA: Diagnosis not present

## 2021-02-22 ENCOUNTER — Ambulatory Visit: Payer: PPO | Admitting: Occupational Therapy

## 2021-02-24 ENCOUNTER — Ambulatory Visit: Payer: PPO | Admitting: Occupational Therapy

## 2021-02-25 ENCOUNTER — Other Ambulatory Visit: Payer: Self-pay

## 2021-02-25 ENCOUNTER — Ambulatory Visit: Payer: PPO | Admitting: Occupational Therapy

## 2021-02-25 DIAGNOSIS — I890 Lymphedema, not elsewhere classified: Secondary | ICD-10-CM | POA: Diagnosis not present

## 2021-02-25 NOTE — Therapy (Signed)
[place] MAIN [facility] SERVICES [address] [place], [place], [address] Phone: [phone]   Fax:  [phone]  Occupational Therapy Treatment  Patient Details  Name: Tami Stewart MRN: [id number] Date of Birth: 1951/11/23 Referring Provider (OT): Elza Rafter, MD   Encounter Date: 02/25/2021   OT End of Session - 02/25/21 1310    Visit Number 15    Number of Visits 36    Date for OT Re-Evaluation 03/22/21    OT Start Time 0100    OT Stop Time 0210    OT Time Calculation (min) 70 min    Activity Tolerance Patient tolerated treatment well;No increased pain    Behavior During Therapy [facility] for tasks assessed/performed           No past medical history on file.  No past surgical history on file.  There were no vitals filed for this visit.   Subjective Assessment - 02/25/21 1415    Subjective  Tami Stewart presents for OT visit 16/36 to address BLE lymphedema. Pt presents with ccl 1 (20-30 mmHg)   knee length compression stocking in place. Pt denies lleg pain. Pt has been assisting spouse with recovery after gall bladder sx this week, so has not yet ordered ccl 2 (30-40 mmHg) compression knee highs    Pertinent History chronic phlebolymphedema, lipodermatosclerosis, varicose vein disease, Dopler + for reflux, endovenous thermal ablation followed by chemical ablation, OSA (uses CPAP)    Limitations decreased standing and waling tolerance, chronic leg pain and swelling,    Repetition Increases Symptoms    Special Tests + Stemmer base of toes bilaterally w/ deep creases at base of toes    Patient Stated Goals reduce leg swelling ; limit progression                        OT Treatments/Exercises (OP) - 02/25/21 1417      ADLs   ADL Education Given Yes      Manual Therapy   Manual Therapy Edema management;Manual Lymphatic Drainage (MLD);Compression Bandaging;Other (comment)    Soft tissue mobilization fibrosis  techniques-gentle    Manual Lymphatic Drainage (MLD) MLD to LLE utilizing short neck sequence, deep abdominal breathing, functional inguinal LN, and proximal to distal J stroke sequences from thigh to foot. Excellent tolerance.    Compression Bandaging Pt donned compression garments her self using friction gloves at end of session.                  OT Education - 02/25/21 1418    Education Details Continued Pt/ CG edu for lymphedema self care  and home program throughout session. Topics include multilayer, gradient compression wrapping, simple self-MLD, therapeutic lymphatic pumping exercises, skin/nail care, risk reduction factors and LE precautions, compression garments/recommendations and wear and care schedule and compression garment donning / doffing using assistive devices. All questions answered to the Pt's satisfaction, and Pt demonstrates understanding by report.    Person(s) Educated Patient    Methods Explanation;Demonstration    Comprehension Verbalized understanding;Returned demonstration;Need further instruction               OT Long Term Goals - 01/28/21 1457      OT LONG TERM GOAL #1   Title Pt will be able to apply multi-layer, short stretch compression wraps using correct gradient techniques with extra time (modified assist)  to return affected limb to premorbid size and shape, to limit leg pain and infection risk,  and to improve safe functional ambulation and mobility.    Baseline dependent    Time 4    Period Days    Status Achieved      OT LONG TERM GOAL #2   Title Patient will demonstrate understanding of LE precautions, prevention strategies and cellulitis signs and symptoms by verbalizing at least 5 examples using a printed reference (modified independence) to limit infection risk and LE progression.    Baseline dependent    Time 4    Period Days    Status Achieved      OT LONG TERM GOAL #3   Title Pt will achieve no less than 10% limb volume  reductions bilaterally below the knees (ankle to tibial tuberosity = A-D) during Intensive Phase CDT to achieve optimal limb volume reduction and to prevent re-accumulation of lymphatic congestion and fibrosis in the leg. limit infection risk, to improve functional ambulation and transfers, to improve functional performance of basic and instrumental ADLs, and to limit LE progression.    Baseline dependent    Time 12    Period Weeks    Status Partially Met   Tami Stewart  has achieved obvious limb volume reduction below the knee in the LLE, but unable to complete comparative volumetrics today. We will complete LLE measurements next visit   Target Date 03/23/21      OT LONG TERM GOAL #4   Title Pt will demonstrate and sustain a least 85% compliance performing all daily LE self-care home program components daily throughout Intensive Phase CDT daily, including recommended skin care regime, lymphatic pumping ther ex, 23/7 compression wraps and simple self-MLD, to ensure optimal limb volume reduction, to limit infection risk and to limit LE progression.    Baseline dependent    Time 12    Period Weeks    Status Achieved      OT LONG TERM GOAL #5   Title Using assistive devices (modified independence) Pt will be able to don and doff appropriate daytime compression garments and HOS devices to limit lymphatic re-accumulation and LE progression.    Baseline Max A    Time 12    Period Weeks    Status Achieved                 Plan - 02/25/21 1412    Clinical Impression Statement BLE appear slightly more swollen again today with ccl 1 stockings. Pt has not yet purchased ccl 2 garments due to caregiver chalenge this week. We Continued LLE MLD, simultaneous skin care and compression therapies this session. Pt denies pain.  Pt donned knee length ccl stockings independently at end of session. Pt agrees with plan to reduce OT frequency to 1 x weekly while awaiting fitting for stronger garments. Cont as  per POC.    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;ADL;Decreased knowledge of use of DME;Pain;Scar mobility;Flexibility;Decreased knowledge of precautions;Skin integrity    Rehab Potential Good    Clinical Decision Making Several treatment options, min-mod task modification necessary    Comorbidities Affecting Occupational Performance: Presence of comorbidities impacting occupational performance    Comorbidities impacting occupational performance description: varicose vein disease, CVI    Modification or Assistance to Complete Evaluation  Min-Moderate modification of  tasks or assist with assess necessary to complete eval    OT Frequency 2x / week    OT Duration 12 weeks    OT Treatment/Interventions Self-care/ADL training;DME and/or AE instruction;Manual lymph drainage;Compression bandaging;Therapeutic exercise;Other (comment);Manual Therapy;Patient/family education   skin care w low ph lotion and castor oil to improve skin hydration and mobility'   Plan BLE complete decongestive therapy (CDT) on 1 leg at a time to limit falls risk. To include MLD, compression bandaging and garments, skin care, ther ex Pt self care edu    Recommended Other Services fit with custom, flat knit compression stockings. Consider thigh highs as tolerated.    Consulted and Agree with Plan of Care Patient           Patient will benefit from skilled therapeutic intervention in order to improve the following deficits and impairments:   Body Structure / Function / Physical Skills: Edema,ADL,Decreased knowledge of use of DME,Pain,Scar mobility,Flexibility,Decreased knowledge of  precautions,Skin integrity       Visit Diagnosis: Lymphedema, not elsewhere classified    Problem List There are no problems to display for this patient.   Andrey Spearman, MS, OTR/L, [facility] 02/25/21 2:18 PM  [place] MAIN [facility] SERVICES [address] [place], [place], [address] Phone: [phone]   Fax:  [phone]  Name: LARAYA PESTKA MRN: [id number] Date of Birth: 1951/04/30

## 2021-03-01 ENCOUNTER — Ambulatory Visit: Payer: PPO | Admitting: Occupational Therapy

## 2021-03-01 ENCOUNTER — Other Ambulatory Visit: Payer: Self-pay

## 2021-03-01 DIAGNOSIS — I890 Lymphedema, not elsewhere classified: Secondary | ICD-10-CM

## 2021-03-01 NOTE — Therapy (Signed)
[place] MAIN [facility] SERVICES [address] [place], [place], [address] Phone: [phone]   Fax:  [phone]  Occupational Therapy Treatment  Patient Details  Name: Tami Stewart MRN: [id number] Date of Birth: 1951-04-18 Referring Provider (OT): Elza Rafter, MD   Encounter Date: 03/01/2021   OT End of Session - 03/01/21 1310    Visit Number 16    Number of Visits 36    Date for OT Re-Evaluation 03/22/21    OT Start Time 0105    OT Stop Time 0200    OT Time Calculation (min) 55 min    Activity Tolerance Patient tolerated treatment well;No increased pain    Behavior During Therapy [facility] for tasks assessed/performed           No past medical history on file.  No past surgical history on file.  There were no vitals filed for this visit.   Subjective Assessment - 03/01/21 1611    Subjective  Tami Stewart presents for OT visit 17/36 to address BLE lymphedema. Pt denies lleg pain. Pt reports she has not yet ordered ccl 2 (30-40 mmHg) compression knee highs    Pertinent History chronic phlebolymphedema, lipodermatosclerosis, varicose vein disease, Dopler + for reflux, endovenous thermal ablation followed by chemical ablation, OSA (uses CPAP)    Limitations decreased standing and waling tolerance, chronic leg pain and swelling,    Repetition Increases Symptoms    Special Tests + Stemmer base of toes bilaterally w/ deep creases at base of toes    Patient Stated Goals reduce leg swelling ; limit progression                        OT Treatments/Exercises (OP) - 03/01/21 1612      ADLs   ADL Education Given Yes      Manual Therapy   Manual Therapy Edema management;Manual Lymphatic Drainage (MLD);Compression Bandaging;Other (comment)    Soft tissue mobilization fibrosis techniques-gentle    Manual Lymphatic Drainage (MLD) MLD to LLE utilizing short neck sequence, deep abdominal breathing, functional inguinal LN, and  proximal to distal J stroke sequences from thigh to foot. Excellent tolerance.    Compression Bandaging Pt donned compression garments her self using friction gloves at end of session.                  OT Education - 03/01/21 1613    Education Details Pt edu for donning and doffing compression stockings using Tyvek slippie ON and OFF. Good return. Needs practice.    Person(s) Educated Patient    Methods Explanation;Demonstration    Comprehension Verbalized understanding;Returned demonstration;Need further instruction               OT Long Term Goals - 01/28/21 1457      OT LONG TERM GOAL #1   Title Pt will be able to apply multi-layer, short stretch compression wraps using correct gradient techniques with extra time (modified assist)  to return affected limb to premorbid size and shape, to limit leg pain and infection risk, and to improve safe functional ambulation and mobility.    Baseline dependent    Time 4    Period Days    Status Achieved      OT LONG TERM GOAL #2   Title Patient will demonstrate understanding of LE precautions, prevention strategies and cellulitis signs and symptoms by verbalizing at least 5 examples using a printed reference (modified independence) to limit infection risk  and LE progression.    Baseline dependent    Time 4    Period Days    Status Achieved      OT LONG TERM GOAL #3   Title Pt will achieve no less than 10% limb volume reductions bilaterally below the knees (ankle to tibial tuberosity = A-D) during Intensive Phase CDT to achieve optimal limb volume reduction and to prevent re-accumulation of lymphatic congestion and fibrosis in the leg. limit infection risk, to improve functional ambulation and transfers, to improve functional performance of basic and instrumental ADLs, and to limit LE progression.    Baseline dependent    Time 12    Period Weeks    Status Partially Met   Tami Stewart  has achieved obvious limb volume reduction  below the knee in the LLE, but unable to complete comparative volumetrics today. We will complete LLE measurements next visit   Target Date 03/23/21      OT LONG TERM GOAL #4   Title Pt will demonstrate and sustain a least 85% compliance performing all daily LE self-care home program components daily throughout Intensive Phase CDT daily, including recommended skin care regime, lymphatic pumping ther ex, 23/7 compression wraps and simple self-MLD, to ensure optimal limb volume reduction, to limit infection risk and to limit LE progression.    Baseline dependent    Time 12    Period Weeks    Status Achieved      OT LONG TERM GOAL #5   Title Using assistive devices (modified independence) Pt will be able to don and doff appropriate daytime compression garments and HOS devices to limit lymphatic re-accumulation and LE progression.    Baseline Max A    Time 12    Period Weeks    Status Achieved                 Plan - 03/01/21 1609    Clinical Impression Statement After skilled teaching  Pt able to use assistive doffing device to remove compression stocking on LLE with MOD A. Pt will practice using device between sessions to improve functional independence. Pt tolerated MLD and skin care to LLE / LLQ without increaase pain. Pt has not yet ordered replacement stocking with increased compression for LLE. Pt able to dont ccl 1 knee high independently. Cont as per POC.    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;ADL;Decreased knowledge of use of DME;Pain;Scar mobility;Flexibility;Decreased knowledge of precautions;Skin integrity    Rehab Potential Good     Clinical Decision Making Several treatment options, min-mod task modification necessary    Comorbidities Affecting Occupational Performance: Presence of comorbidities impacting occupational performance    Comorbidities impacting occupational performance description: varicose vein disease, CVI    Modification or Assistance to Complete Evaluation  Min-Moderate modification of tasks or assist with assess necessary to complete eval    OT Frequency 2x / week    OT Duration 12 weeks    OT Treatment/Interventions Self-care/ADL training;DME and/or AE instruction;Manual lymph drainage;Compression bandaging;Therapeutic exercise;Other (comment);Manual Therapy;Patient/family education   skin care w low ph lotion and castor oil to improve skin hydration and mobility'   Plan BLE complete decongestive therapy (CDT) on 1 leg at a time to limit falls risk.  To include MLD, compression bandaging and garments, skin care, ther ex Pt self care edu    Recommended Other Services fit with custom, flat knit compression stockings. Consider thigh highs as tolerated.    Consulted and Agree with Plan of Care Patient           Patient will benefit from skilled therapeutic intervention in order to improve the following deficits and impairments:   Body Structure / Function / Physical Skills: Edema,ADL,Decreased knowledge of use of DME,Pain,Scar mobility,Flexibility,Decreased knowledge of precautions,Skin integrity       Visit Diagnosis: Lymphedema, not elsewhere classified    Problem List There are no problems to display for this patient.   Andrey Spearman, MS, OTR/L, [facility] 03/01/21 4:16 PM   [place] MAIN [facility] SERVICES [address] [place], [place], [address] Phone: [phone]   Fax:  [phone]  Name: Tami Stewart MRN: [id number] Date of Birth: 1951-02-10

## 2021-03-03 ENCOUNTER — Ambulatory Visit: Payer: PPO | Admitting: Occupational Therapy

## 2021-03-04 ENCOUNTER — Ambulatory Visit: Payer: PPO | Admitting: Occupational Therapy

## 2021-03-08 ENCOUNTER — Ambulatory Visit: Payer: PPO | Admitting: Occupational Therapy

## 2021-03-10 ENCOUNTER — Ambulatory Visit: Payer: PPO | Admitting: Occupational Therapy

## 2021-03-11 ENCOUNTER — Other Ambulatory Visit: Payer: Self-pay

## 2021-03-11 ENCOUNTER — Ambulatory Visit: Payer: PPO | Admitting: Occupational Therapy

## 2021-03-11 DIAGNOSIS — I890 Lymphedema, not elsewhere classified: Secondary | ICD-10-CM | POA: Diagnosis not present

## 2021-03-11 NOTE — Therapy (Signed)
[place] MAIN [facility] SERVICES [address] [place], [place], [address] Phone: [phone]   Fax:  [phone]  Occupational Therapy Treatment  Patient Details  Name: Tami Stewart MRN: [id number] Date of Birth: 1951-06-22 Referring Provider (OT): Elza Rafter, MD   Encounter Date: 03/11/2021   OT End of Session - 03/11/21 1307    Visit Number 18    Number of Visits 36    Date for OT Re-Evaluation 03/22/21    OT Start Time 0103    OT Stop Time 0211    OT Time Calculation (min) 68 min    Activity Tolerance Patient tolerated treatment well;No increased pain    Behavior During Therapy [facility] for tasks assessed/performed           No past medical history on file.  No past surgical history on file.  There were no vitals filed for this visit.   Subjective Assessment - 03/11/21 1525    Subjective  Tami Stewart presents for OT visit 18/36 to address BLE lymphedema. Pt denies LE related leg pain. Pt reports she has ordered ccl 2 (30-40 mmHg) compression knee highs and expects to have thm at next weeks appointment.    Pertinent History chronic phlebolymphedema, lipodermatosclerosis, varicose vein disease, Dopler + for reflux, endovenous thermal ablation followed by chemical ablation, OSA (uses CPAP)    Limitations decreased standing and waling tolerance, chronic leg pain and swelling,    Repetition Increases Symptoms    Special Tests + Stemmer base of toes bilaterally w/ deep creases at base of toes    Patient Stated Goals reduce leg swelling ; limit progression                        OT Treatments/Exercises (OP) - 03/11/21 0001      ADLs   ADL Education Given Yes      Manual Therapy   Manual Therapy Edema management;Manual Lymphatic Drainage (MLD);Compression Bandaging;Other (comment)    Soft tissue mobilization fibrosis techniques-gentle    Manual Lymphatic Drainage (MLD) MLD to LLE utilizing short neck sequence, deep  abdominal breathing, functional inguinal LN, and proximal to distal J stroke sequences from thigh to foot. Excellent tolerance.    Compression Bandaging Pt donned compression garments her self using friction gloves at end of session.                  OT Education - 03/11/21 1530    Education Details Continued Pt/ CG edu for lymphedema self care  and home program throughout session. Topics include multilayer, gradient compression wrapping, simple self-MLD, therapeutic lymphatic pumping exercises, skin/nail care, risk reduction factors and LE precautions, compression garments/recommendations and wear and care schedule and compression garment donning / doffing using assistive devices. All questions answered to the Pt's satisfaction, and Pt demonstrates understanding by report.    Person(s) Educated Patient    Methods Explanation;Demonstration    Comprehension Verbalized understanding;Returned demonstration;Need further instruction               OT Long Term Goals - 03/11/21 1309      OT LONG TERM GOAL #1   Title Pt will be able to apply multi-layer, short stretch compression wraps using correct gradient techniques with extra time (modified assist)  to return affected limb to premorbid size and shape, to limit leg pain and infection risk, and to improve safe functional ambulation and mobility.    Baseline dependent    Time  4    Period Days    Status Achieved      OT LONG TERM GOAL #2   Title Patient will demonstrate understanding of LE precautions, prevention strategies and cellulitis signs and symptoms by verbalizing at least 5 examples using a printed reference (modified independence) to limit infection risk and LE progression.    Baseline dependent    Time 4    Period Days    Status Achieved      OT LONG TERM GOAL #3   Title Pt will achieve no less than 10% limb volume reductions bilaterally below the knees (ankle to tibial tuberosity = A-D) during Intensive Phase CDT to  achieve optimal limb volume reduction and to prevent re-accumulation of lymphatic congestion and fibrosis in the leg. limit infection risk, to improve functional ambulation and transfers, to improve functional performance of basic and instrumental ADLs, and to limit LE progression.    Baseline dependent    Time 12    Period Weeks    Status Partially Met   Tami Stewart  has achieved obvious limb volume reduction below the knee in the LLE, but unable to complete comparative volumetrics today. We will complete LLE measurements next visit     OT LONG TERM GOAL #4   Title Pt will demonstrate and sustain a least 85% compliance performing all daily LE self-care home program components daily throughout Intensive Phase CDT daily, including recommended skin care regime, lymphatic pumping ther ex, 23/7 compression wraps and simple self-MLD, to ensure optimal limb volume reduction, to limit infection risk and to limit LE progression.    Baseline dependent    Time 12    Period Weeks    Status Achieved      OT LONG TERM GOAL #5   Title Using assistive devices (modified independence) Pt will be able to don and doff appropriate daytime compression garments and HOS devices to limit lymphatic re-accumulation and LE progression.    Baseline Max A    Time 12    Period Weeks    Status Achieved                 Plan - 03/11/21 1516    Clinical Impression Statement Tami Stewart tolerated MLD , skin care and reapplication of compression withoutincreased pain today. Redness at scar on anterior R leg continues to lighten. Pt has ordered recommended garments and we wil fit them as soon as delivered. Skin condition continues to improve with lipodermatosclerosis persisting. Cont as per POC.    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;ADL;Decreased knowledge of use of DME;Pain;Scar mobility;Flexibility;Decreased knowledge of precautions;Skin integrity    Rehab Potential Good    Clinical Decision Making Several treatment options, min-mod task modification necessary    Comorbidities Affecting Occupational Performance: Presence of comorbidities impacting occupational performance    Comorbidities impacting occupational performance description: varicose vein disease, CVI    Modification or Assistance to Complete Evaluation  Min-Moderate modification of tasks or assist with assess necessary to complete eval    OT Frequency 2x / week    OT Duration 12 weeks    OT Treatment/Interventions Self-care/ADL training;DME and/or AE instruction;Manual lymph drainage;Compression bandaging;Therapeutic exercise;Other (comment);Manual Therapy;Patient/family education   skin care w low ph  lotion and castor oil to improve skin hydration and mobility'   Plan BLE complete decongestive therapy (CDT) on 1 leg at a time to limit falls risk. To include MLD, compression bandaging and garments, skin care, ther ex Pt self care edu    Recommended Other Services fit with custom, flat knit compression stockings. Consider thigh highs as tolerated.    Consulted and Agree with Plan of Care Patient           Patient will benefit from skilled therapeutic intervention in order to improve the following deficits and impairments:   Body Structure / Function / Physical Skills: Edema,ADL,Decreased knowledge of use of DME,Pain,Scar mobility,Flexibility,Decreased knowledge of precautions,Skin integrity       Visit Diagnosis: Lymphedema, not elsewhere classified    Problem List There are no problems to display for this patient.   Andrey Spearman, MS, OTR/L, [facility] 03/11/21 3:32 PM  [place] MAIN [facility] SERVICES [address] [place], [place], [address] Phone: [phone]   Fax:  [phone]  Name: AKSHARA BLUMENTHAL MRN: [id number] Date of Birth: 07/29/1951

## 2021-03-15 ENCOUNTER — Ambulatory Visit: Payer: PPO | Admitting: Occupational Therapy

## 2021-03-17 ENCOUNTER — Ambulatory Visit: Payer: PPO | Admitting: Occupational Therapy

## 2021-03-18 ENCOUNTER — Ambulatory Visit: Payer: PPO | Admitting: Occupational Therapy

## 2021-03-22 ENCOUNTER — Encounter: Payer: PPO | Admitting: Occupational Therapy

## 2021-03-25 ENCOUNTER — Other Ambulatory Visit: Payer: Self-pay

## 2021-03-25 ENCOUNTER — Ambulatory Visit: Payer: PPO | Attending: Family Medicine | Admitting: Occupational Therapy

## 2021-03-25 DIAGNOSIS — I890 Lymphedema, not elsewhere classified: Secondary | ICD-10-CM | POA: Diagnosis not present

## 2021-03-25 DIAGNOSIS — G4721 Circadian rhythm sleep disorder, delayed sleep phase type: Secondary | ICD-10-CM | POA: Diagnosis not present

## 2021-03-25 DIAGNOSIS — G4733 Obstructive sleep apnea (adult) (pediatric): Secondary | ICD-10-CM | POA: Diagnosis not present

## 2021-03-25 NOTE — Therapy (Signed)
[place] MAIN [facility] SERVICES [address] [place], [place], [address] Phone: [phone]   Fax:  [phone]  Occupational Therapy Treatment  Patient Details  Name: Tami Stewart MRN: [id number] Date of Birth: Jan 21, 1951 Referring Provider (OT): Elza Rafter, MD   Encounter Date: 03/25/2021   OT End of Session - 03/25/21 1419    Visit Number 19    Number of Visits 36    Date for OT Re-Evaluation 06/22/21    OT Start Time 0205    OT Stop Time 0310    OT Time Calculation (min) 65 min    Activity Tolerance Patient tolerated treatment well;No increased pain    Behavior During Therapy [facility] for tasks assessed/performed           No past medical history on file.  No past surgical history on file.  There were no vitals filed for this visit.   Subjective Assessment - 03/25/21 1420    Subjective  Tami Stewart presents for OT visit 19/36 to address BLE lymphedema.  Pt last seen 3/34. She brings recommended compression stockings to clinic for assessment.  Pt states she likes the new stronger stockings and feels they are controlling swelling "aboout the same."    Pertinent History chronic phlebolymphedema, lipodermatosclerosis, varicose vein disease, Dopler + for reflux, endovenous thermal ablation followed by chemical ablation, OSA (uses CPAP)    Limitations decreased standing and waling tolerance, chronic leg pain and swelling,    Repetition Increases Symptoms    Special Tests + Stemmer base of toes bilaterally w/ deep creases at base of toes    Patient Stated Goals reduce leg swelling ; limit progression               LYMPHEDEMA/ONCOLOGY QUESTIONNAIRE - 03/25/21 1533      Right Lower Extremity Lymphedema   Other RLE A-D volume= 3245.97 ml. RLE E-G volume= [id number]. RLE A-G volume = 7147.5 ml.    Other R leg  volume is decreased by 0.77 oerall since commencihng OT for CDT and measring volumetrics on 12/31/20. R thigh volume is  increased overall by 0.4%, and R LE from ankle to groin (A-G) is decreased in volume overall by 0.17%. These values reveal extremely stable limb volume in the dominant RLE with only very slight fluctuations since Jan 2022.      Left Lower Extremity Lymphedema   Other L leg (A-D) volume = 3214.7 ml. L thigh volume = 2978.05 ml. L full limb volume = 6192.8 ml.    Other L leg volume (A-D)  is decreased by 2.63%. L thigh  (E-G) is increased by 24.6%, and  L full limb (A-G)  is decreased in volume overall by 13.53% since initially measured on 12/31/20. The full limb volume for LLE meets 10% volume reduction goal.                   OT Treatments/Exercises (OP) - 03/25/21 1532      ADLs   ADL Education Given Yes      Manual Therapy   Manual Therapy Edema management;Manual Lymphatic Drainage (MLD);Compression Bandaging;Other (comment)    Edema Management BLE comparative limb volumetrics    Compression Bandaging Pt donned compression garments her self using friction gloves at end of session.                       OT Long Term Goals - 03/11/21 1309      OT LONG  TERM GOAL #1   Title Pt will be able to apply multi-layer, short stretch compression wraps using correct gradient techniques with extra time (modified assist)  to return affected limb to premorbid size and shape, to limit leg pain and infection risk, and to improve safe functional ambulation and mobility.    Baseline dependent    Time 4    Period Days    Status Achieved      OT LONG TERM GOAL #2   Title Patient will demonstrate understanding of LE precautions, prevention strategies and cellulitis signs and symptoms by verbalizing at least 5 examples using a printed reference (modified independence) to limit infection risk and LE progression.    Baseline dependent    Time 4    Period Days    Status Achieved      OT LONG TERM GOAL #3   Title Pt will achieve no less than 10% limb volume reductions bilaterally below  the knees (ankle to tibial tuberosity = A-D) during Intensive Phase CDT to achieve optimal limb volume reduction and to prevent re-accumulation of lymphatic congestion and fibrosis in the leg. limit infection risk, to improve functional ambulation and transfers, to improve functional performance of basic and instrumental ADLs, and to limit LE progression.    Baseline dependent    Time 12    Period Weeks    Status Partially Met   Tami Stewart  has achieved obvious limb volume reduction below the knee in the LLE, but unable to complete comparative volumetrics today. We will complete LLE measurements next visit     OT LONG TERM GOAL #4   Title Pt will demonstrate and sustain a least 85% compliance performing all daily LE self-care home program components daily throughout Intensive Phase CDT daily, including recommended skin care regime, lymphatic pumping ther ex, 23/7 compression wraps and simple self-MLD, to ensure optimal limb volume reduction, to limit infection risk and to limit LE progression.    Baseline dependent    Time 12    Period Weeks    Status Achieved      OT LONG TERM GOAL #5   Title Using assistive devices (modified independence) Pt will be able to don and doff appropriate daytime compression garments and HOS devices to limit lymphatic re-accumulation and LE progression.    Baseline Max A    Time 12    Period Weeks    Status Achieved                 Plan - 03/25/21 1537    Clinical Impression Statement Pt pleased with increased compression ( ccl 2- 30-40 mmHg) in new stockings for LLE. She will continue to use the ccl 1 stockings (20-30 mmHg) on the RLE. BLE comparative limb volumetrics reveal that the R leg  volume is decreased by 0.77 overall since commencihng OT for CDT and measuring volumetrics on 12/31/20. R thigh volume is increased overall by 0.4%, and R LE from ankle to groin (A-G) is decreased in volume overall by 0.17%. These values reveal extremely stable limb  volume in the dominant RLE with only very slight fluctuations since Jan 2022. L leg volume (A-D)  is decreased by 2.63%. L thigh  (E-G) is increased by 24.6%, and  L full limb (A-G)  is decreased in volume overall by 13.53% since initially measured on 12/31/20. The full limb volume for LLE meets 10% volume reduction goal.Pt in agreement with plan to explore replacing the  basic sequential pneumatic compression device, or "  pump" that she's used for a year with a more advanced device that provides proximal to distal compression  terminating at the abdomen via 32 chambers emulating MLD vs distal to proximal, or retrograde, compression device with 4 chambers that terminates below the inguinal LN in the groin. OT will submit insurance info for benefit check. If preauthorized manufacturer's rep will assist with trial of Flexitouch device in clinic. Cont as per OC.    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;ADL;Decreased knowledge of use of DME;Pain;Scar mobility;Flexibility;Decreased knowledge of precautions;Skin integrity    Rehab Potential Good    Clinical Decision Making Several treatment options, min-mod task modification necessary    Comorbidities Affecting Occupational Performance: Presence of comorbidities impacting occupational performance    Comorbidities impacting occupational performance description: varicose vein disease, CVI    Modification or Assistance to Complete Evaluation  Min-Moderate modification of tasks or assist with assess necessary to complete eval    OT Frequency 2x / week    OT Duration 12 weeks    OT Treatment/Interventions Self-care/ADL  training;DME and/or AE instruction;Manual lymph drainage;Compression bandaging;Therapeutic exercise;Other (comment);Manual Therapy;Patient/family education   skin care w low ph lotion and castor oil to improve skin hydration and mobility'   Plan BLE complete decongestive therapy (CDT) on 1 leg at a time to limit falls risk. To include MLD, compression bandaging and garments, skin care, ther ex Pt self care edu    Recommended Other Services fit with custom, flat knit compression stockings. Consider thigh highs as tolerated.    Consulted and Agree with Plan of Care Patient           Patient will benefit from skilled therapeutic intervention in order to improve the following deficits and impairments:   Body Structure / Function / Physical Skills: Edema,ADL,Decreased knowledge of use of DME,Pain,Scar mobility,Flexibility,Decreased knowledge of precautions,Skin integrity       Visit Diagnosis: Lymphedema, not elsewhere classified - Plan: Ot plan of care cert/re-cert    Problem List There are no problems to display for this patient.  Andrey Spearman, MS, OTR/L, [facility] 03/25/21 3:49 PM   [place] MAIN [facility] SERVICES [address] [place], [place], [address] Phone: [phone]   Fax:  [phone]  Name: KERSTEN SALMONS MRN: [id number] Date of Birth: Jan 22, 1951

## 2021-03-29 ENCOUNTER — Encounter: Payer: PPO | Admitting: Occupational Therapy

## 2021-03-29 ENCOUNTER — Other Ambulatory Visit: Payer: Self-pay

## 2021-03-29 ENCOUNTER — Ambulatory Visit
Admission: RE | Admit: 2021-03-29 | Discharge: 2021-03-29 | Disposition: A | Discharge status: Home / Self Care | Payer: PPO | Source: Ambulatory Visit | Attending: Family Medicine | Admitting: Family Medicine

## 2021-03-29 DIAGNOSIS — Z1231 Encounter for screening mammogram for malignant neoplasm of breast: Secondary | ICD-10-CM

## 2021-04-05 ENCOUNTER — Encounter: Payer: PPO | Admitting: Occupational Therapy

## 2021-04-06 ENCOUNTER — Ambulatory Visit: Payer: PPO | Admitting: Occupational Therapy

## 2021-04-06 ENCOUNTER — Other Ambulatory Visit: Payer: Self-pay

## 2021-04-06 DIAGNOSIS — I890 Lymphedema, not elsewhere classified: Secondary | ICD-10-CM | POA: Diagnosis not present

## 2021-04-06 NOTE — Therapy (Signed)
[place] MAIN [facility] SERVICES [address] [place], [place], [address] Phone: [phone]   Fax:  [phone]  Occupational Therapy Treatment Note and Progress Report: Lymphedema Care  Patient Details  Name: Tami Stewart MRN: [id number] Date of Birth: Jun 28, 1951 Referring Provider (OT): Elza Rafter, MD   Encounter Date: 04/06/2021   OT End of Session - 04/06/21 1523    Visit Number 20    Number of Visits 36    Date for OT Re-Evaluation 06/22/21    OT Start Time 0313    OT Stop Time 0403    OT Time Calculation (min) 50 min    Activity Tolerance Patient tolerated treatment well;No increased pain    Behavior During Therapy [facility] for tasks assessed/performed           No past medical history on file.  No past surgical history on file.  There were no vitals filed for this visit.   Subjective Assessment - 04/06/21 1633    Subjective  [name] presents for OT visit 20/36 to address BLE lymphedema. Pt presents with recommended compression garments in place. She reports stockings are working well and are comfortable. Pt denies leg pain today.    Pertinent History chronic phlebolymphedema, lipodermatosclerosis, varicose vein disease, Dopler + for reflux, endovenous thermal ablation followed by chemical ablation, OSA (uses CPAP)    Limitations decreased standing and waling tolerance, chronic leg pain and swelling,    Repetition Increases Symptoms    Special Tests + Stemmer base of toes bilaterally w/ deep creases at base of toes    Patient Stated Goals reduce leg swelling ; limit progression    Currently in Pain? No/denies                        OT Treatments/Exercises (OP) - 04/06/21 1634      ADLs   ADL Education Given Yes      Manual Therapy   Manual Therapy Edema management;Manual Lymphatic Drainage (MLD);Compression Bandaging;Other (comment)    Manual Lymphatic Drainage (MLD) MLD to LLE utilizing short neck  sequence, deep abdominal breathing, functional inguinal LN, and proximal to distal J stroke sequences from thigh to foot. Excellent tolerance.    Compression Bandaging Pt donned compression garments her self using friction gloves at end of session.                  OT Education - 04/06/21 1629    Education Details Continued Pt/ CG edu for lymphedema self care  and home program throughout session. Topics include multilayer, gradient compression wrapping, simple self-MLD, therapeutic lymphatic pumping exercises, skin/nail care, risk reduction factors and LE precautions, compression garments/recommendations and wear and care schedule and compression garment donning / doffing using assistive devices. All questions answered to the Pt's satisfaction, and Pt demonstrates understanding by report.   Reviewed all lymphedema precautions and signs/ symprtoms of cellulitis again   Person(s) Educated Patient    Methods Explanation;Demonstration    Comprehension Verbalized understanding;Returned demonstration;Need further instruction               OT Long Term Goals - 04/06/21 1629      OT LONG TERM GOAL #1   Title Pt will be able to apply multi-layer, short stretch compression wraps using correct gradient techniques with extra time (modified assist)  to return affected limb to premorbid size and shape, to limit leg pain and infection risk, and to improve safe functional ambulation  and mobility.    Baseline dependent    Time 4    Period Days    Status Achieved      OT LONG TERM GOAL #2   Title Patient will demonstrate understanding of LE precautions, prevention strategies and cellulitis signs and symptoms by verbalizing at least 5 examples using a printed reference (modified independence) to limit infection risk and LE progression.    Baseline dependent    Time 4    Period Days    Status Achieved      OT LONG TERM GOAL #3   Title Pt will achieve no less than 10% limb volume reductions  bilaterally below the knees (ankle to tibial tuberosity = A-D) during Intensive Phase CDT to achieve optimal limb volume reduction and to prevent re-accumulation of lymphatic congestion and fibrosis in the leg. limit infection risk, to improve functional ambulation and transfers, to improve functional performance of basic and instrumental ADLs, and to limit LE progression.    Baseline dependent    Time 12    Period Weeks    Status Partially Met   [name]  has achieved obvious limb volume reduction below the knee in the LLE, but unable to complete comparative volumetrics today. We will complete LLE measurements next visit     OT LONG TERM GOAL #4   Title Pt will demonstrate and sustain a least 85% compliance performing all daily LE self-care home program components daily throughout Intensive Phase CDT daily, including recommended skin care regime, lymphatic pumping ther ex, 23/7 compression wraps and simple self-MLD, to ensure optimal limb volume reduction, to limit infection risk and to limit LE progression.    Baseline dependent    Time 12    Period Weeks    Status Achieved      OT LONG TERM GOAL #5   Title Using assistive devices (modified independence) Pt will be able to don and doff appropriate daytime compression garments and HOS devices to limit lymphatic re-accumulation and LE progression.    Baseline Max A    Time 12    Period Weeks    Status Achieved                 Plan - 04/06/21 1632    Clinical Impression Statement Refer to volumetrics measured and last note of 03/25/21 for detailed acount of limb volume reduction and progress towards  all OT goals for lymphedema care to date. Skin condition is improved. Pt has habituated wearing appropriate BLE knee length compression that contains swelling and reduces lymph stasis, in turn improving tissue health and reducing infection risk. Pt is performing daily skin care and knows how to perform simple self-MLD. Pt agrees with plan  to transition from Intensive  phase CDT to self-management phase. She will continue all LE self care home program componenets diligently and call with any questions / concerns. Mrs. Panchal will return in 3 months for follow along and garment check. She has achieved most OTR goals for LE care at present.    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;ADL;Decreased knowledge of use of DME;Pain;Scar mobility;Flexibility;Decreased knowledge of precautions;Skin integrity    Rehab Potential Good    Clinical Decision Making Several treatment options, min-mod task  modification necessary    Comorbidities Affecting Occupational Performance: Presence of comorbidities impacting occupational performance    Comorbidities impacting occupational performance description: varicose vein disease, CVI    Modification or Assistance to Complete Evaluation  Min-Moderate modification of tasks or assist with assess necessary to complete eval    OT Frequency 2x / week    OT Duration Other (comment)   3 month  follow up- Pt to call w questions/ concerns during interval. Transitioning to self-management CDT today.   OT Treatment/Interventions Self-care/ADL training;DME and/or AE instruction;Manual lymph drainage;Compression bandaging;Therapeutic exercise;Other (comment);Manual Therapy;Patient/family education   skin care w low ph lotion and castor oil to improve skin hydration and mobility'   Plan BLE complete decongestive therapy (CDT) on 1 leg at a time to limit falls risk. To include MLD, compression bandaging and garments, skin care, ther ex Pt self care edu    Recommended Other Services  fit with custom, flat knit compression stockings. Consider thigh highs as tolerated.    Consulted and Agree with Plan of Care Patient           Patient will benefit from skilled therapeutic intervention in order to improve the following deficits and impairments:   Body Structure / Function / Physical Skills: Edema,ADL,Decreased knowledge of use of DME,Pain,Scar mobility,Flexibility,Decreased knowledge of precautions,Skin integrity       Visit Diagnosis: Lymphedema, not elsewhere classified    Problem List There are no problems to display for this patient.   Andrey Spearman, MS, OTR/L, [facility] 04/06/21 4:36 PM  [place] MAIN [facility] SERVICES [address] [place], [place], [address] Phone: [phone]   Fax:  [phone]  Name: KATALEIA QUARANTA MRN: [id number] Date of Birth: 01/04/1951

## 2021-04-07 DIAGNOSIS — L309 Dermatitis, unspecified: Secondary | ICD-10-CM | POA: Diagnosis not present

## 2021-04-07 DIAGNOSIS — L570 Actinic keratosis: Secondary | ICD-10-CM | POA: Diagnosis not present

## 2021-04-07 DIAGNOSIS — L578 Other skin changes due to chronic exposure to nonionizing radiation: Secondary | ICD-10-CM | POA: Diagnosis not present

## 2021-04-07 DIAGNOSIS — D225 Melanocytic nevi of trunk: Secondary | ICD-10-CM | POA: Diagnosis not present

## 2021-04-08 ENCOUNTER — Ambulatory Visit: Payer: PPO | Admitting: Occupational Therapy

## 2021-04-13 ENCOUNTER — Ambulatory Visit: Payer: PPO | Admitting: Occupational Therapy

## 2021-04-14 DIAGNOSIS — I87323 Chronic venous hypertension (idiopathic) with inflammation of bilateral lower extremity: Secondary | ICD-10-CM | POA: Diagnosis not present

## 2021-04-14 DIAGNOSIS — R600 Localized edema: Secondary | ICD-10-CM | POA: Diagnosis not present

## 2021-04-14 DIAGNOSIS — I890 Lymphedema, not elsewhere classified: Secondary | ICD-10-CM | POA: Diagnosis not present

## 2021-04-19 DIAGNOSIS — G4733 Obstructive sleep apnea (adult) (pediatric): Secondary | ICD-10-CM | POA: Diagnosis not present

## 2021-04-29 DIAGNOSIS — I8311 Varicose veins of right lower extremity with inflammation: Secondary | ICD-10-CM | POA: Diagnosis not present

## 2021-04-29 DIAGNOSIS — I8312 Varicose veins of left lower extremity with inflammation: Secondary | ICD-10-CM | POA: Diagnosis not present

## 2021-05-11 DIAGNOSIS — I8312 Varicose veins of left lower extremity with inflammation: Secondary | ICD-10-CM | POA: Diagnosis not present

## 2021-05-11 DIAGNOSIS — R600 Localized edema: Secondary | ICD-10-CM | POA: Diagnosis not present

## 2021-05-24 ENCOUNTER — Ambulatory Visit: Payer: PPO | Attending: Family Medicine | Admitting: Occupational Therapy

## 2021-05-24 ENCOUNTER — Other Ambulatory Visit: Payer: Self-pay

## 2021-05-24 DIAGNOSIS — I890 Lymphedema, not elsewhere classified: Secondary | ICD-10-CM | POA: Insufficient documentation

## 2021-05-24 NOTE — Therapy (Signed)
[place] MAIN [facility] SERVICES [address] [place], [place], [address] Phone: [phone]   Fax:  [phone]  Occupational Therapy Treatment  Patient Details  Name: Tami Stewart MRN: [id number] Date of Birth: [birth year] Referring Provider (OT): Elza Rafter, MD   Encounter Date: 05/24/2021   OT End of Session - 05/24/21 1403    Visit Number 21    Number of Visits 36    Date for OT Re-Evaluation 06/22/21    OT Start Time 0104    OT Stop Time 0205    OT Time Calculation (min) 61 min    Activity Tolerance Patient tolerated treatment well;No increased pain    Behavior During Therapy [facility] for tasks assessed/performed           No past medical history on file.  No past surgical history on file.  There were no vitals filed for this visit.   Subjective Assessment - 05/24/21 1414    Subjective  Tami Stewart presents for OT visit 21/36 to address BLE lymphedema. Pt presents for OT follow along for management phase CDT. Pt last seen 04/06/21. Pt  presents with compression stockings in place. Pt denies LE-r4elated leg pain today. She reports she is seeing Dr. Jones Skene for ablation injections. She states she feels like the foot portion of the R compressin stocking is too short compared with the L stocking.    Pertinent History chronic phlebolymphedema, lipodermatosclerosis, varicose vein disease, Dopler + for reflux, endovenous thermal ablation followed by chemical ablation, OSA (uses CPAP)    Limitations decreased standing and waling tolerance, chronic leg pain and swelling,    Repetition Increases Symptoms    Special Tests + Stemmer base of toes bilaterally w/ deep creases at base of toes    Patient Stated Goals reduce leg swelling ; limit progression                        OT Treatments/Exercises (OP) - 05/24/21 1524      ADLs   ADL Education Given Yes      Manual Therapy   Manual Therapy Edema management;Manual  Lymphatic Drainage (MLD);Compression Bandaging    Manual Lymphatic Drainage (MLD) MLD to LLE utilizing short neck sequence, deep abdominal breathing, functional inguinal LN, and proximal to distal J stroke sequences from thigh to foot. Excellent tolerance.    Compression Bandaging Completed anatomical measurements to check size on compression stockings. Garments are worn and in need of replacement for optimal LE self management .                  OT Education - 05/24/21 1531    Education Details Continued Pt/ CG edu for lymphedema self care  and home program throughout session. Topics include multilayer, gradient compression wrapping, simple self-MLD, therapeutic lymphatic pumping exercises, skin/nail care, risk reduction factors and LE precautions, compression garments/recommendations and wear and care schedule and compression garment donning / doffing using assistive devices. All questions answered to the Pt's satisfaction, and Pt demonstrates understanding by report.   Reviewed all lymphedema precautions and signs/ symprtoms of cellulitis again   Person(s) Educated Patient    Methods Explanation;Demonstration    Comprehension Verbalized understanding;Returned demonstration;Need further instruction               OT Long Term Goals - 04/06/21 1629      OT LONG TERM GOAL #1   Title Pt will be able to apply multi-layer, short stretch compression  wraps using correct gradient techniques with extra time (modified assist)  to return affected limb to premorbid size and shape, to limit leg pain and infection risk, and to improve safe functional ambulation and mobility.    Baseline dependent    Time 4    Period Days    Status Achieved      OT LONG TERM GOAL #2   Title Patient will demonstrate understanding of LE precautions, prevention strategies and cellulitis signs and symptoms by verbalizing at least 5 examples using a printed reference (modified independence) to limit infection risk  and LE progression.    Baseline dependent    Time 4    Period Days    Status Achieved      OT LONG TERM GOAL #3   Title Pt will achieve no less than 10% limb volume reductions bilaterally below the knees (ankle to tibial tuberosity = A-D) during Intensive Phase CDT to achieve optimal limb volume reduction and to prevent re-accumulation of lymphatic congestion and fibrosis in the leg. limit infection risk, to improve functional ambulation and transfers, to improve functional performance of basic and instrumental ADLs, and to limit LE progression.    Baseline dependent    Time 12    Period Weeks    Status Partially Met   Tami Stewart  has achieved obvious limb volume reduction below the knee in the LLE, but unable to complete comparative volumetrics today. We will complete LLE measurements next visit     OT LONG TERM GOAL #4   Title Pt will demonstrate and sustain a least 85% compliance performing all daily LE self-care home program components daily throughout Intensive Phase CDT daily, including recommended skin care regime, lymphatic pumping ther ex, 23/7 compression wraps and simple self-MLD, to ensure optimal limb volume reduction, to limit infection risk and to limit LE progression.    Baseline dependent    Time 12    Period Weeks    Status Achieved      OT LONG TERM GOAL #5   Title Using assistive devices (modified independence) Pt will be able to don and doff appropriate daytime compression garments and HOS devices to limit lymphatic re-accumulation and LE progression.    Baseline Max A    Time 12    Period Weeks    Status Achieved                 Plan - 05/24/21 1404    Clinical Impression Statement Tami Stewart returns to OT for follow-along support for self-management phase of lymphedema care. Pt is using off the shelf medical grade compression knee highs bilaterally, ccl 2 (30-40 mmHg) on the L (size III) and ccl 1 (20-30 mmHg) size II on the R. Anatomical measurements  reveal Pt fits similarly into size III stockings. Since it is slightly large at the calf  we will increase compression class on R to ccl 2 so garments are same size and class-wize. This may help to reduce swelling on dorsal feet as well. Provided skin care and MLD to LLE for remainder of session. Skin is very well hydrated and Pt is managing well. She agrees with plan to return for follow-along in 3 months at the end of th4 hot weather months and will call PRN.    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 Edema;ADL;Decreased knowledge of use of DME;Pain;Scar mobility;Flexibility;Decreased knowledge of precautions;Skin integrity    Rehab Potential Good    Clinical Decision Making Several treatment options, min-mod task modification necessary    Comorbidities Affecting Occupational Performance: Presence of comorbidities impacting occupational performance    Comorbidities impacting occupational performance description: varicose vein disease, CVI    Modification or Assistance to Complete Evaluation  Min-Moderate modification of tasks or assist with assess necessary to complete eval    OT Frequency 2x / week    OT Duration Other (comment)   3 month  follow up- Pt to call w questions/ concerns during interval. Transitioning to self-management CDT today.   OT Treatment/Interventions Self-care/ADL training;DME and/or AE instruction;Manual lymph drainage;Compression bandaging;Therapeutic exercise;Other (comment);Manual Therapy;Patient/family education   skin care w low ph lotion and castor oil to improve skin hydration and mobility'   Plan BLE complete decongestive therapy  (CDT) on 1 leg at a time to limit falls risk. To include MLD, compression bandaging and garments, skin care, ther ex Pt self care edu    Recommended Other Services fit with custom, flat knit compression stockings. Consider thigh highs as tolerated.    Consulted and Agree with Plan of Care Patient           Patient will benefit from skilled therapeutic intervention in order to improve the following deficits and impairments:   Body Structure / Function / Physical Skills: Edema,ADL,Decreased knowledge of use of DME,Pain,Scar mobility,Flexibility,Decreased knowledge of precautions,Skin integrity       Visit Diagnosis: Lymphedema, not elsewhere classified    Problem List There are no problems to display for this patient.   Andrey Spearman, MS, OTR/L, [facility] 05/24/21 3:32 PM  [place] MAIN [facility] SERVICES [address] [place], [place], [address] Phone: [phone]   Fax:  [phone]  Name: Tami Stewart MRN: [id number] Date of Birth: Jan 11, 1951

## 2021-05-24 NOTE — Patient Instructions (Signed)
Lymphedema Self- Care Instructions  1. EXERCISE: Perform lymphatic pumping there ex 2 x a day. While wearing your compression wraps or garments. Perform 10 reps of each exercise bilaterally and be sure to perform them in order. Don;t skip around!  OMIT PARTIAL SIT UP  2. MLD: Perform simple self-Manual Lymphatic Drainage (MLD) at least once a day as directed.  3. WRAPS: When using short stretch Compression wraps they are to be worn 23 hrs/ 7 days/wk during Intensive Phase of Complete Decongestive Therapy (CDT).Building tolerance may take time and practice, so don't get discouraged. If bandages begin to feel tight during periods of inactivity and/or during the night, try performing your exercises to loosen them.   4. GARMENTS: During Management Phase CDT your compression garments are to be worn during waking hours when active. Do NOT sleep in your garments!!   5. PUT YOUR FEET UP! Elevate your feet and legs and feet to the level of your heart whenever you are sitting down.   6. SKIN: Carefully monitor skin condition and perform impeccable hygiene daily. Bathe skin with mild soap and water and apply low pH lotion (aka Eucerin ) to improve hydration and limit infection risk.

## 2021-06-14 DIAGNOSIS — I8312 Varicose veins of left lower extremity with inflammation: Secondary | ICD-10-CM | POA: Diagnosis not present

## 2021-06-14 DIAGNOSIS — I83892 Varicose veins of left lower extremities with other complications: Secondary | ICD-10-CM | POA: Diagnosis not present

## 2021-07-05 DIAGNOSIS — I8312 Varicose veins of left lower extremity with inflammation: Secondary | ICD-10-CM | POA: Diagnosis not present

## 2021-07-23 DIAGNOSIS — I8312 Varicose veins of left lower extremity with inflammation: Secondary | ICD-10-CM | POA: Diagnosis not present

## 2021-08-17 ENCOUNTER — Encounter: Payer: PPO | Admitting: Occupational Therapy

## 2021-08-20 ENCOUNTER — Encounter: Payer: PPO | Admitting: Occupational Therapy

## 2021-08-24 ENCOUNTER — Encounter: Payer: PPO | Admitting: Occupational Therapy

## 2021-08-25 DIAGNOSIS — G4733 Obstructive sleep apnea (adult) (pediatric): Secondary | ICD-10-CM | POA: Diagnosis not present

## 2021-08-25 DIAGNOSIS — G4721 Circadian rhythm sleep disorder, delayed sleep phase type: Secondary | ICD-10-CM | POA: Diagnosis not present

## 2021-08-27 ENCOUNTER — Encounter: Payer: PPO | Admitting: Occupational Therapy

## 2021-08-27 DIAGNOSIS — L578 Other skin changes due to chronic exposure to nonionizing radiation: Secondary | ICD-10-CM | POA: Diagnosis not present

## 2021-08-27 DIAGNOSIS — L814 Other melanin hyperpigmentation: Secondary | ICD-10-CM | POA: Diagnosis not present

## 2021-08-27 DIAGNOSIS — D485 Neoplasm of uncertain behavior of skin: Secondary | ICD-10-CM | POA: Diagnosis not present

## 2021-08-27 DIAGNOSIS — D225 Melanocytic nevi of trunk: Secondary | ICD-10-CM | POA: Diagnosis not present

## 2021-08-27 DIAGNOSIS — L570 Actinic keratosis: Secondary | ICD-10-CM | POA: Diagnosis not present

## 2021-08-27 DIAGNOSIS — L710 Perioral dermatitis: Secondary | ICD-10-CM | POA: Diagnosis not present

## 2021-09-03 ENCOUNTER — Other Ambulatory Visit: Payer: Self-pay

## 2021-09-03 ENCOUNTER — Ambulatory Visit: Payer: PPO | Attending: Family Medicine | Admitting: Occupational Therapy

## 2021-09-03 DIAGNOSIS — I890 Lymphedema, not elsewhere classified: Secondary | ICD-10-CM | POA: Insufficient documentation

## 2021-09-03 NOTE — Therapy (Signed)
[place] MAIN [facility] SERVICES [address] [place], [place], [address] Phone: [phone]   Fax:  [phone]  Occupational Therapy Treatment  Patient Details  Name: Tami Stewart MRN: [id number] Date of Birth: [birth year] Referring Provider (OT): Elza Rafter, MD   Encounter Date: 09/03/2021   OT End of Session - 09/03/21 1206     Visit Number 22    Number of Visits 36    Date for OT Re-Evaluation 12/02/21    OT Start Time 1108    OT Stop Time 1205    OT Time Calculation (min) 57 min    Activity Tolerance Patient tolerated treatment well;No increased pain    Behavior During Therapy [facility] for tasks assessed/performed             No past medical history on file.  No past surgical history on file.  There were no vitals filed for this visit.   Subjective Assessment - 09/03/21 1210     Subjective  Tami Stewart presents for OT visit 22/36 to address BLE lymphedema. Pt last seen in June for follow along. Pt reports she had additional injections in LLE with Dr. Vista Lawman. Pt denies LE-related leg pain. Shhe states she would like to order HOS devices.    Pertinent History chronic phlebolymphedema, lipodermatosclerosis, varicose vein disease, Dopler + for reflux, endovenous thermal ablation followed by chemical ablation, OSA (uses CPAP)    Limitations decreased standing and waling tolerance, chronic leg pain and swelling,    Repetition Increases Symptoms    Special Tests + Stemmer base of toes bilaterally w/ deep creases at base of toes    Patient Stated Goals reduce leg swelling ; limit progression                          OT Treatments/Exercises (OP) - 09/03/21 1211       ADLs   ADL Education Given Yes      Manual Therapy   Manual Therapy Edema management;Manual Lymphatic Drainage (MLD)    Manual therapy comments skin care w/ low ph castor oil throughout MLD    Edema Management Pt independently dons and doffs  compression stockings. Garments appear in good condition.    Manual Lymphatic Drainage (MLD) MLD to LLE utilizing short neck sequence, deep abdominal breathing, functional inguinal LN, and proximal to distal J stroke sequences from thigh to foot. Excellent tolerance.    Compression Bandaging Completed anatomical measurements for BLE, Jobst RELAX, ccl 2, AD for HOS                    OT Education - 09/03/21 1213     Education Details Continued Pt/ CG edu for lymphedema self care  and home program throughout session. Topics include multilayer, gradient compression wrapping, simple self-MLD, therapeutic lymphatic pumping exercises, skin/nail care, risk reduction factors and LE precautions, compression garments/recommendations and wear and care schedule and compression garment donning / doffing using assistive devices. All questions answered to the Pt's satisfaction, and Pt demonstrates understanding by report.   Reviewed all lymphedema precautions and signs/ symprtoms of cellulitis again   Person(s) Educated Patient    Methods Explanation;Demonstration    Comprehension Verbalized understanding;Returned demonstration;Need further instruction                 OT Long Term Goals - 04/06/21 1629       OT LONG TERM GOAL #1   Title Pt will be  able to apply multi-layer, short stretch compression wraps using correct gradient techniques with extra time (modified assist)  to return affected limb to premorbid size and shape, to limit leg pain and infection risk, and to improve safe functional ambulation and mobility.    Baseline dependent    Time 4    Period Days    Status Achieved      OT LONG TERM GOAL #2   Title Patient will demonstrate understanding of LE precautions, prevention strategies and cellulitis signs and symptoms by verbalizing at least 5 examples using a printed reference (modified independence) to limit infection risk and LE progression.    Baseline dependent    Time 4     Period Days    Status Achieved      OT LONG TERM GOAL #3   Title Pt will achieve no less than 10% limb volume reductions bilaterally below the knees (ankle to tibial tuberosity = A-D) during Intensive Phase CDT to achieve optimal limb volume reduction and to prevent re-accumulation of lymphatic congestion and fibrosis in the leg. limit infection risk, to improve functional ambulation and transfers, to improve functional performance of basic and instrumental ADLs, and to limit LE progression.    Baseline dependent    Time 12    Period Weeks    Status Partially Met   Tami Stewart  has achieved obvious limb volume reduction below the knee in the LLE, but unable to complete comparative volumetrics today. We will complete LLE measurements next visit     OT LONG TERM GOAL #4   Title Pt will demonstrate and sustain a least 85% compliance performing all daily LE self-care home program components daily throughout Intensive Phase CDT daily, including recommended skin care regime, lymphatic pumping ther ex, 23/7 compression wraps and simple self-MLD, to ensure optimal limb volume reduction, to limit infection risk and to limit LE progression.    Baseline dependent    Time 12    Period Weeks    Status Achieved      OT LONG TERM GOAL #5   Title Using assistive devices (modified independence) Pt will be able to don and doff appropriate daytime compression garments and HOS devices to limit lymphatic re-accumulation and LE progression.    Baseline Max A    Time 12    Period Weeks    Status Achieved                   Plan - 09/03/21 1207     Clinical Impression Statement Tami Stewart returns to OT for follow-along support for self-management phase of lymphedema care. Pt is using off the shelf medical grade compression knee highs bilaterally, ccl 2 (30-40 mmHg) on the L (size III) and ccl 1 (20-30 mmHg) size II on the R. Completed anatomical measurements for custom HOS devices (knee length,  Jobst RELAX, ccl 2) to facilitate improved lymphatic circulation and reduce fibrosis build up during HOS. Faxed to DME vendor at end of session.  Provided skin care and MLD to LLE for remainder of session. Skin is very well hydrated and Pt is managing well. She agrees with plan to return for device fitting and to continue with follow-along visits q 3 months and PRN.    OT Occupational Profile and History Comprehensive Assessment- Review of records and extensive additional review of physical, cognitive, psychosocial history related to current functional performance    Occupational performance deficits (Please refer to evaluation for details): ADL's;IADL's;Work;Leisure;Social Participation   body image  Body Structure / Function / Physical Skills Edema;ADL;Decreased knowledge of use of DME;Pain;Scar mobility;Flexibility;Decreased knowledge of precautions;Skin integrity    Rehab Potential Good    Clinical Decision Making Several treatment options, min-mod task modification necessary    Comorbidities Affecting Occupational Performance: Presence of comorbidities impacting occupational performance    Comorbidities impacting occupational performance description: varicose vein disease, CVI    Modification or Assistance to Complete Evaluation  Min-Moderate modification of tasks or assist with assess necessary to complete eval    OT Frequency 2x / week    OT Duration Other (comment)   3 month  follow up- Pt to call w questions/ concerns during interval. Transitioning to self-management CDT today.   OT Treatment/Interventions Self-care/ADL training;DME and/or AE instruction;Manual lymph drainage;Compression bandaging;Therapeutic exercise;Other (comment);Manual Therapy;Patient/family education   skin care w low ph lotion and castor oil to improve skin hydration and mobility'   Plan BLE complete decongestive therapy (CDT) on 1 leg at a time to limit falls risk. To include MLD, compression bandaging and garments,  skin care, ther ex Pt self care edu    Recommended Other Services fit with custom, flat knit compression stockings. Consider thigh highs as tolerated.    Consulted and Agree with Plan of Care Patient             Patient will benefit from skilled therapeutic intervention in order to improve the following deficits and impairments:   Body Structure / Function / Physical Skills: Edema, ADL, Decreased knowledge of use of DME, Pain, Scar mobility, Flexibility, Decreased knowledge of precautions, Skin integrity       Visit Diagnosis: Lymphedema, not elsewhere classified - Plan: Ot plan of care cert/re-cert    Problem List There are no problems to display for this patient.   Andrey Spearman, MS, OTR/L, [facility] 09/03/21 12:15 PM   [place] MAIN [facility] SERVICES [address] [place], [place], [address] Phone: [phone]   Fax:  [phone]  Name: ORALIA CRIGER MRN: [id number] Date of Birth: 30-Mar-1951

## 2021-09-03 NOTE — Patient Instructions (Signed)

## 2021-09-24 DIAGNOSIS — G4733 Obstructive sleep apnea (adult) (pediatric): Secondary | ICD-10-CM | POA: Diagnosis not present

## 2021-11-03 DIAGNOSIS — Z1231 Encounter for screening mammogram for malignant neoplasm of breast: Secondary | ICD-10-CM | POA: Diagnosis not present

## 2021-11-03 DIAGNOSIS — Z01419 Encounter for gynecological examination (general) (routine) without abnormal findings: Secondary | ICD-10-CM | POA: Diagnosis not present

## 2021-12-24 DIAGNOSIS — L570 Actinic keratosis: Secondary | ICD-10-CM | POA: Diagnosis not present

## 2021-12-24 DIAGNOSIS — D485 Neoplasm of uncertain behavior of skin: Secondary | ICD-10-CM | POA: Diagnosis not present

## 2021-12-24 DIAGNOSIS — L719 Rosacea, unspecified: Secondary | ICD-10-CM | POA: Diagnosis not present

## 2021-12-24 DIAGNOSIS — L578 Other skin changes due to chronic exposure to nonionizing radiation: Secondary | ICD-10-CM | POA: Diagnosis not present

## 2021-12-29 ENCOUNTER — Ambulatory Visit: Payer: PPO | Attending: Family Medicine | Admitting: Occupational Therapy

## 2021-12-29 ENCOUNTER — Other Ambulatory Visit: Payer: Self-pay

## 2021-12-29 DIAGNOSIS — I890 Lymphedema, not elsewhere classified: Secondary | ICD-10-CM | POA: Diagnosis not present

## 2021-12-29 NOTE — Patient Instructions (Signed)

## 2021-12-29 NOTE — Therapy (Signed)
[place] MAIN [facility] SERVICES [address] [place], [place], [address] Phone: [phone]   Fax:  [phone]  Occupational Therapy Treatment Note and Discharge Summary: BLE Lymphedema Care  Patient Details  Name: Tami Stewart MRN: [id number] Date of Birth: 1951-03-13 No data recorded  Encounter Date: 12/29/2021   OT End of Session - 12/29/21 1124     Visit Number 23    Number of Visits 36    Date for OT Re-Evaluation 03/29/22    Activity Tolerance Patient tolerated treatment well;No increased pain    Behavior During Therapy [facility] for tasks assessed/performed             No past medical history on file.  No past surgical history on file.  There were no vitals filed for this visit.   Subjective Assessment - 12/29/21 1246     Subjective  Tami Stewart presents for OT visit 23/36 to address BLE lymphedema. Pt last seen in September, 2022 for follow along. Pt reports she is managing lymphedema well and just replaced compression stockings with new closed toe ones. Pt denies LE-related leg pain. Unfortunately the HOS devices we measured for last visit were denied coverage by her insurance, and she opted not to pay for them OOP. Pt reports she continues to see Dr. Jones Skene for assistance with LLE issues.    Pertinent History chronic phlebolymphedema, lipodermatosclerosis, varicose vein disease, Dopler + for reflux, endovenous thermal ablation followed by chemical ablation, OSA (uses CPAP)    Limitations decreased standing and waling tolerance, chronic leg pain and swelling,    Repetition Increases Symptoms    Special Tests + Stemmer base of toes bilaterally w/ deep creases at base of toes    Patient Stated Goals reduce leg swelling ; limit progression                 LYMPHEDEMA/ONCOLOGY QUESTIONNAIRE - 12/29/21 1250       Right Lower Extremity Lymphedema   Other RLE A-D volume= 3037.8 ml. RLE E-G volume= 4298.5 ml. RLE A-G  volume = 7336.26m.    Other RLE A-D volume is reduced by 6.4%. RLE E-G vol is increased by 10.2%. Full limb vol for RLE is increased by 2.6% since 09/03/21.      Left Lower Extremity Lymphedema   Other LLE A-D volume= 2855.12 ml. LLE E-G volume= 3604.34. RLE LLE A-G volume = 6459.6 ml.    Other LLE A-D vol is decreaased by 11.18%. LLE E-G vol is increased by 2%, and LLE full limb volume is increased by 4.3%.                     OT Treatments/Exercises (OP) - 12/29/21 1250       ADLs   ADL Education Given Yes      Manual Therapy   Manual Therapy Edema management    Edema Management Pt independently dons and doffs compression stockings. Garments appear in good condition.    Soft tissue mobilization BLE comparative limb volumetrics                    OT Education - 12/29/21 1259     Education Details Continued Pt/ CG edu for lymphedema self care  and home program throughout session. Topics include multilayer, gradient compression wrapping, simple self-MLD, therapeutic lymphatic pumping exercises, skin/nail care, risk reduction factors and LE precautions, compression garments/recommendations and wear and care schedule and compression garment donning / doffing using assistive  devices. All questions answered to the Pt's satisfaction, and Pt demonstrates understanding by report.   Reviewed all lymphedema precautions and signs/ symprtoms of cellulitis again   Person(s) Educated Patient    Methods Explanation;Demonstration    Comprehension Verbalized understanding;Returned demonstration;Need further instruction                 OT Long Term Goals - 12/29/21 1245       OT LONG TERM GOAL #1   Title Pt will be able to apply multi-layer, short stretch compression wraps using correct gradient techniques with extra time (modified assist)  to return affected limb to premorbid size and shape, to limit leg pain and infection risk, and to improve safe functional ambulation  and mobility.    Baseline dependent    Time 4    Period Days    Status Achieved      OT LONG TERM GOAL #2   Title Patient will demonstrate understanding of LE precautions, prevention strategies and cellulitis signs and symptoms by verbalizing at least 5 examples using a printed reference (modified independence) to limit infection risk and LE progression.    Baseline dependent    Time 4    Period Days    Status Achieved      OT LONG TERM GOAL #3   Title Pt will achieve no less than 10% limb volume reductions bilaterally below the knees (ankle to tibial tuberosity = A-D) during Intensive Phase CDT to achieve optimal limb volume reduction and to prevent re-accumulation of lymphatic congestion and fibrosis in the leg. limit infection risk, to improve functional ambulation and transfers, to improve functional performance of basic and instrumental ADLs, and to limit LE progression.    Baseline dependent    Time 12    Period Weeks    Status Partially Met   Tami Stewart  has achieved obvious limb volume reduction below the knee in the LLE, but unable to complete comparative volumetrics today. We will complete LLE measurements next visit     OT LONG TERM GOAL #4   Title Pt will demonstrate and sustain a least 85% compliance performing all daily LE self-care home program components daily throughout Intensive Phase CDT daily, including recommended skin care regime, lymphatic pumping ther ex, 23/7 compression wraps and simple self-MLD, to ensure optimal limb volume reduction, to limit infection risk and to limit LE progression.    Baseline dependent    Time 12    Period Weeks    Status Achieved      OT LONG TERM GOAL #5   Title Using assistive devices (modified independence) Pt will be able to don and doff appropriate daytime compression garments and HOS devices to limit lymphatic re-accumulation and LE progression.    Baseline Max A    Time 12    Period Weeks    Status Achieved                    Plan - 12/29/21 1229     Clinical Impression Statement Tami Stewart returns to OT for follow-along support for self-management phase of lymphedema care. Pt was last seen 09/03/21 for follow along. Pt continues to use off the shelf medical grade compression knee highs bilaterally, ccl 2 (30-40 mmHg) on the L (size III) and ccl 1 (20-30 mmHg) size II on the R. with good control of leg swelling bilaterally. Unfortunately Pt's nsurance benefits wdo not cover custom device we measured her for at last visit, and out of  pocket cost was too high for her to obtain these herself without insurance . She reports she no longer uses basic pneumatic pump for lymphedema management. Physical exam and BLE comparative limb volumetrics reveal excellent lymphedema management at present. L leg volume, the most affected limb segment, is reduced by 11.18% since last measured on 02/22/21. L thigh is increased in volume by 2.0%. The R leg volume is reduced by 6.4% since last measured on 09/03/21. Th thigh volume is increased by 10.2%. Pt denies weight gain or loss. RLE skin is WNL. L eg is reddened and shiney , which Pt explains is worsened after dermatology procedue. Other signs/ symptoms of infection are absent. Pt continues to didligently manage lymphedema independently with excellent compliance to all home program components. She's iun agreement with plan to DC OT for LE care today as she is managing well , purchases correct compression as needed. She'll return for follow up during hot summer months when swelling is more challenging to manage. It's been a pleasure working with Tami Stewart. Her OT outcome for sucessful self-care for chronic, progressive lymphgedema is an excellent one .    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 Edema;ADL;Decreased knowledge of use of DME;Pain;Scar mobility;Flexibility;Decreased knowledge of precautions;Skin integrity    Rehab Potential Good    Clinical Decision Making Several treatment options, min-mod task modification necessary    Comorbidities Affecting Occupational Performance: Presence of comorbidities impacting occupational performance    Comorbidities impacting occupational performance description: varicose vein disease, CVI    Modification or Assistance to Complete Evaluation  Min-Moderate modification of tasks or assist with assess necessary to complete eval    OT Frequency 2x / week    OT Duration Other (comment)   3 month  follow up- Pt to call w questions/ concerns during interval. Transitioning to self-management CDT today.   OT Treatment/Interventions Self-care/ADL training;DME and/or AE instruction;Manual lymph drainage;Compression bandaging;Therapeutic exercise;Other (comment);Manual Therapy;Patient/family education   skin care w low ph lotion and castor oil to improve skin hydration and mobility'   Plan BLE complete decongestive therapy (CDT) on 1 leg at a time to limit falls risk. To include MLD, compression bandaging and garments, skin care, ther ex Pt self care edu    Recommended Other Services fit with custom, flat knit compression stockings. Consider thigh highs as tolerated.    Consulted and Agree with Plan of Care Patient             Patient will benefit from skilled therapeutic intervention in order to improve the following deficits and impairments:   Body Structure / Function / Physical Skills: Edema, ADL, Decreased knowledge of use of DME, Pain, Scar mobility, Flexibility, Decreased knowledge of precautions, Skin integrity       Visit Diagnosis: Lymphedema, not elsewhere classified - Plan: Ot plan of care  cert/re-cert, Ot plan of care cert/re-cert    Problem List There are no problems to display for this patient.   Andrey Spearman, MS, OTR/L, [facility] 12/29/21 1:02 PM   [place] MAIN [facility] SERVICES [address] [place], [place], [address] Phone: [phone]   Fax:  [phone]  Name: Tami Stewart MRN: [id number] Date of Birth: 06/10/1951

## 2022-03-01 DIAGNOSIS — G4721 Circadian rhythm sleep disorder, delayed sleep phase type: Secondary | ICD-10-CM | POA: Diagnosis not present

## 2022-03-01 DIAGNOSIS — G4733 Obstructive sleep apnea (adult) (pediatric): Secondary | ICD-10-CM | POA: Diagnosis not present

## 2022-03-04 ENCOUNTER — Other Ambulatory Visit: Payer: Self-pay | Admitting: Family Medicine

## 2022-03-04 DIAGNOSIS — I890 Lymphedema, not elsewhere classified: Secondary | ICD-10-CM | POA: Diagnosis not present

## 2022-03-04 DIAGNOSIS — F411 Generalized anxiety disorder: Secondary | ICD-10-CM | POA: Diagnosis not present

## 2022-03-04 DIAGNOSIS — M899 Disorder of bone, unspecified: Secondary | ICD-10-CM | POA: Diagnosis not present

## 2022-03-04 DIAGNOSIS — Z1231 Encounter for screening mammogram for malignant neoplasm of breast: Secondary | ICD-10-CM

## 2022-03-04 DIAGNOSIS — N952 Postmenopausal atrophic vaginitis: Secondary | ICD-10-CM | POA: Diagnosis not present

## 2022-03-04 DIAGNOSIS — J309 Allergic rhinitis, unspecified: Secondary | ICD-10-CM | POA: Diagnosis not present

## 2022-03-04 DIAGNOSIS — M2010 Hallux valgus (acquired), unspecified foot: Secondary | ICD-10-CM | POA: Diagnosis not present

## 2022-03-04 DIAGNOSIS — G4733 Obstructive sleep apnea (adult) (pediatric): Secondary | ICD-10-CM | POA: Diagnosis not present

## 2022-03-04 DIAGNOSIS — I10 Essential (primary) hypertension: Secondary | ICD-10-CM | POA: Diagnosis not present

## 2022-03-04 DIAGNOSIS — Z0000 Encounter for general adult medical examination without abnormal findings: Secondary | ICD-10-CM | POA: Diagnosis not present

## 2022-03-04 DIAGNOSIS — Z85828 Personal history of other malignant neoplasm of skin: Secondary | ICD-10-CM | POA: Diagnosis not present

## 2022-03-04 DIAGNOSIS — E782 Mixed hyperlipidemia: Secondary | ICD-10-CM | POA: Diagnosis not present

## 2022-03-15 DIAGNOSIS — G4733 Obstructive sleep apnea (adult) (pediatric): Secondary | ICD-10-CM | POA: Diagnosis not present

## 2022-03-23 DIAGNOSIS — R600 Localized edema: Secondary | ICD-10-CM | POA: Diagnosis not present

## 2022-03-23 DIAGNOSIS — I8312 Varicose veins of left lower extremity with inflammation: Secondary | ICD-10-CM | POA: Diagnosis not present

## 2022-03-23 DIAGNOSIS — I890 Lymphedema, not elsewhere classified: Secondary | ICD-10-CM | POA: Diagnosis not present

## 2022-04-01 ENCOUNTER — Ambulatory Visit
Admission: RE | Admit: 2022-04-01 | Discharge: 2022-04-01 | Disposition: A | Discharge status: Home / Self Care | Payer: PPO | Source: Ambulatory Visit | Attending: Family Medicine | Admitting: Family Medicine

## 2022-04-01 DIAGNOSIS — Z1231 Encounter for screening mammogram for malignant neoplasm of breast: Secondary | ICD-10-CM

## 2022-04-05 ENCOUNTER — Ambulatory Visit: Payer: PPO | Admitting: Podiatry

## 2022-04-05 ENCOUNTER — Ambulatory Visit: Payer: PPO

## 2022-04-05 ENCOUNTER — Encounter: Payer: Self-pay | Admitting: Podiatry

## 2022-04-05 DIAGNOSIS — M21619 Bunion of unspecified foot: Secondary | ICD-10-CM | POA: Diagnosis not present

## 2022-04-05 NOTE — Progress Notes (Signed)
?  Subjective:  ?Patient ID: Tami Stewart, female    DOB: Jul 18, 1951,   MRN: [id number] ? ?Chief Complaint  ?Patient presents with  ? Bunions  ?   left foot bunion  ? ? ?71 y.o. female presents for concern of left foot bunion that has been bothering her for about 3-4 months. Relates it can be constant pain depending on what shoes she wears. No swelling or redness. Relates when in pain it throbs. She has tried OTC medications to help.  Denies any other pedal complaints. Denies n/v/f/c.  ? ?No past medical history on file. ? ?Objective:  ?Physical Exam: ?Vascular: DP/PT pulses 2/4 bilateral. CFT <3 seconds. Normal hair growth on digits. No edema.  ?Skin. No lacerations or abrasions bilateral feet.  ?Musculoskeletal: MMT 5/5 bilateral lower extremities in DF, PF, Inversion and Eversion. Deceased ROM in DF of ankle joint.  ?Neurological: Sensation intact to light touch.  ? ?Assessment:  ? ?1. Bunion   ? ? ? ?Plan:  ?Patient was evaluated and treated and all questions answered. ?X-rays reviewed and discussed with patient. No acute fractures or dislocations noted. Metadductus noted with minimal increase in IM angle. Increased HAV angle with mild loss of joint space of first MPJ.  ?-Discussed HAV and treatment options;conservative and surgical management; risks, benefits, alternatives discussed. All patient's questions answered. ?-Discussed padding and wide shoe gear.   ?-Recommend continue with good supportive shoes and inserts.  ?-Discussed surgical options.  ?-Patient to return to office as needed or sooner if condition worsens. ? ? ?Lorenda Peck, DPM  ? ? ?

## 2022-04-08 DIAGNOSIS — L821 Other seborrheic keratosis: Secondary | ICD-10-CM | POA: Diagnosis not present

## 2022-04-08 DIAGNOSIS — D225 Melanocytic nevi of trunk: Secondary | ICD-10-CM | POA: Diagnosis not present

## 2022-04-08 DIAGNOSIS — L570 Actinic keratosis: Secondary | ICD-10-CM | POA: Diagnosis not present

## 2022-04-08 DIAGNOSIS — L578 Other skin changes due to chronic exposure to nonionizing radiation: Secondary | ICD-10-CM | POA: Diagnosis not present

## 2022-04-08 DIAGNOSIS — L814 Other melanin hyperpigmentation: Secondary | ICD-10-CM | POA: Diagnosis not present

## 2022-04-19 DIAGNOSIS — I83891 Varicose veins of right lower extremities with other complications: Secondary | ICD-10-CM | POA: Diagnosis not present

## 2022-04-27 DIAGNOSIS — R600 Localized edema: Secondary | ICD-10-CM | POA: Diagnosis not present

## 2022-04-27 DIAGNOSIS — I890 Lymphedema, not elsewhere classified: Secondary | ICD-10-CM | POA: Diagnosis not present

## 2022-04-27 DIAGNOSIS — I872 Venous insufficiency (chronic) (peripheral): Secondary | ICD-10-CM | POA: Diagnosis not present

## 2022-05-06 ENCOUNTER — Ambulatory Visit (AMBULATORY_SURGERY_CENTER): Payer: PPO | Admitting: *Deleted

## 2022-05-06 VITALS — Ht 63.0 in | Wt 145.0 lb

## 2022-05-06 DIAGNOSIS — Z86010 Personal history of colonic polyps: Secondary | ICD-10-CM

## 2022-05-06 NOTE — Progress Notes (Signed)
No egg or soy allergy known to patient  No issues known to pt with past sedation with any surgeries or procedures Patient denies ever being told they had issues or difficulty with intubation  No FH of Malignant Hyperthermia Pt is not on diet pills Pt is not on  home 02  Pt is not on blood thinners  Pt denies issues with constipation  No A fib or A flutter  PT STATES PERF. COLON AFTER COLON WITH DB-  BRODIE TOLD HER SHE NEEDS PEDS SCOPE   PV completed over the phone. Pt verified name, DOB, address and insurance during PV today.  Pt mailed instruction packet with copy of consent form to read and not return, and instructions.  Pt encouraged to call with questions or issues.  If pt has My chart, procedure instructions sent via My Chart  Insurance confirmed with pt at [facility] today

## 2022-05-27 ENCOUNTER — Encounter: Payer: PPO | Admitting: Gastroenterology

## 2022-07-01 ENCOUNTER — Encounter: Payer: PPO | Admitting: Gastroenterology

## 2022-07-20 DIAGNOSIS — G4733 Obstructive sleep apnea (adult) (pediatric): Secondary | ICD-10-CM | POA: Diagnosis not present

## 2022-08-10 ENCOUNTER — Encounter: Payer: Self-pay | Admitting: Gastroenterology

## 2022-08-12 DIAGNOSIS — L209 Atopic dermatitis, unspecified: Secondary | ICD-10-CM | POA: Diagnosis not present

## 2022-08-12 DIAGNOSIS — L578 Other skin changes due to chronic exposure to nonionizing radiation: Secondary | ICD-10-CM | POA: Diagnosis not present

## 2022-08-12 DIAGNOSIS — L570 Actinic keratosis: Secondary | ICD-10-CM | POA: Diagnosis not present

## 2022-08-12 DIAGNOSIS — C44519 Basal cell carcinoma of skin of other part of trunk: Secondary | ICD-10-CM | POA: Diagnosis not present

## 2022-08-15 ENCOUNTER — Telehealth: Payer: Self-pay | Admitting: Gastroenterology

## 2022-08-15 NOTE — Telephone Encounter (Signed)
Inbound call from patient requesting a call back for  updated prep info for upcoming procedure 08/19/22. Please give a call back to further advise.  Thank you

## 2022-08-15 NOTE — Telephone Encounter (Signed)
Went over new prep instructions with the patient and answered all questions at this time. Also new prep instructions sent to pt's Mychart-pt is aware.

## 2022-08-19 ENCOUNTER — Ambulatory Visit (AMBULATORY_SURGERY_CENTER): Payer: PPO | Admitting: Gastroenterology

## 2022-08-19 ENCOUNTER — Encounter: Payer: Self-pay | Admitting: Gastroenterology

## 2022-08-19 VITALS — BP 115/66 | HR 63 | Temp 98.0°F | Resp 15 | Ht 63.0 in | Wt 145.0 lb

## 2022-08-19 DIAGNOSIS — I10 Essential (primary) hypertension: Secondary | ICD-10-CM | POA: Diagnosis not present

## 2022-08-19 DIAGNOSIS — E785 Hyperlipidemia, unspecified: Secondary | ICD-10-CM | POA: Diagnosis not present

## 2022-08-19 DIAGNOSIS — Z86010 Personal history of colonic polyps: Secondary | ICD-10-CM | POA: Diagnosis not present

## 2022-08-19 DIAGNOSIS — Z09 Encounter for follow-up examination after completed treatment for conditions other than malignant neoplasm: Secondary | ICD-10-CM

## 2022-08-19 DIAGNOSIS — F419 Anxiety disorder, unspecified: Secondary | ICD-10-CM | POA: Diagnosis not present

## 2022-08-19 DIAGNOSIS — G4730 Sleep apnea, unspecified: Secondary | ICD-10-CM | POA: Diagnosis not present

## 2022-08-19 MED ORDER — SODIUM CHLORIDE 0.9 % IV SOLN: 500.0000 mL | Freq: Once | INTRAVENOUS | Status: DC

## 2022-08-19 NOTE — Op Note (Signed)
[place] Patient Name: Tami Stewart Procedure Date: 08/19/2022 9:38 AM MRN: [id number] Endoscopist: Jackquline Denmark , MD Age: 71 Referring MD:  Date of Birth: 1951-03-01 Gender: Female Account #: [id number] Procedure:                Colonoscopy Indications:              High risk colon cancer surveillance: Personal                            history of colonic polyps Medicines:                Monitored Anesthesia Care Procedure:                Pre-Anesthesia Assessment:                           - Prior to the procedure, a History and Physical                            was performed, and patient medications and                            allergies were reviewed. The patient's tolerance of                            previous anesthesia was also reviewed. The risks                            and benefits of the procedure and the sedation                            options and risks were discussed with the patient.                            All questions were answered, and informed consent                            was obtained. Prior Anticoagulants: The patient has                            taken no previous anticoagulant or antiplatelet                            agents. ASA Grade Assessment: II - A patient with                            mild systemic disease. After reviewing the risks                            and benefits, the patient was deemed in                            satisfactory condition to undergo the procedure.  After obtaining informed consent, the colonoscope                            was passed under direct vision. Throughout the                            procedure, the patient's blood pressure, pulse, and                            oxygen saturations were monitored continuously. The                            Olympus PCF-H190DL ([id number]) Colonoscope was                            introduced through the anus and advanced to  the 2                            cm into the ileum. The colonoscopy was performed                            without difficulty. The patient tolerated the                            procedure well. The quality of the bowel                            preparation was good. The terminal ileum, ileocecal                            valve, appendiceal orifice, and rectum were                            photographed. Scope In: 9:41:56 AM Scope Out: 9:57:26 AM Scope Withdrawal Time: 0 hours 10 minutes 10 seconds  Total Procedure Duration: 0 hours 15 minutes 30 seconds  Findings:                 Multiple medium-mouthed diverticula were found in                            the sigmoid colon, few in descending colon and rare                            in ascending colon.                           Non-bleeding internal hemorrhoids were found during                            retroflexion. The hemorrhoids were small and Grade                            I (internal hemorrhoids that do not prolapse).  The terminal ileum appeared normal.                           The exam was otherwise without abnormality on                            direct and retroflexion views. Complications:            No immediate complications. Estimated Blood Loss:     Estimated blood loss: none. Impression:               - Moderate predominantly sigmoid diverticulosis.                           - Non-bleeding internal hemorrhoids.                           - The examined portion of the ileum was normal.                           - The examination was otherwise normal on direct                            and retroflexion views.                           - No specimens collected. Recommendation:           - Patient has a contact number available for                            emergencies. The signs and symptoms of potential                            delayed complications were discussed with the                             patient. Return to normal activities tomorrow.                            Written discharge instructions were provided to the                            patient.                           - High fiber diet.                           - Continue present medications.                           - No need to repeat unless any new problems.                           - The findings and recommendations were discussed  with the patient's family. Jackquline Denmark, MD 08/19/2022 10:02:51 AM This report has been signed electronically.

## 2022-08-19 NOTE — Progress Notes (Signed)
Sedate, gd SR, tolerated procedure well, VSS, report to RN 

## 2022-08-19 NOTE — Progress Notes (Signed)
Pt's states no medical or surgical changes since previsit or office visit. 

## 2022-08-19 NOTE — Progress Notes (Signed)
[facility] History and Physical   Primary Care Physician:  Mayra Neer, MD   Reason for Procedure:    history of polyps  Plan:     colonoscopy     HPI: Tami Stewart is a 71 y.o. female    Past Medical History:  Diagnosis Date   Adenomatous polyps    Allergy    Anemia    PAST HX   Anxiety    Cancer ([place])    SKIN CANCER   Cataract    LEFT EYE   GERD (gastroesophageal reflux disease)    Hyperlipidemia    CONTROLLED ON MEDS   Hypertension    CONTROLLED ON MEDS   Perforated sigmoid colon ([place])    BRODIE PER PT   Sleep apnea    USES CPAP    Past Surgical History:  Procedure Laterality Date   BUNIONECTOMY Right    CHOLECYSTECTOMY     COLONOSCOPY     MUSCLE TENDON REPAIR     LEFT FOOT   PARTIAL HYSTERECTOMY     POLYPECTOMY     RETINAL TEAR REPAIR CRYOTHERAPY Left    TUBAL LIGATION      Prior to Admission medications   Medication Sig Start Date End Date Taking? Authorizing Provider  Biotin 10 MG CAPS biotin 10,000 mcg capsule   Yes [provider]  cetirizine (ZYRTEC) 10 MG tablet    Yes [provider]  Cholecalciferol 25 MCG (1000 UT) tablet Take by mouth.   Yes [provider]  Coenzyme Q10 (CO Q-10) 50 MG CAPS    Yes [provider]  escitalopram (LEXAPRO) 10 MG tablet Take 10 mg by mouth daily. 03/29/22  Yes [provider]  estradiol (VIVELLE-DOT) 0.025 MG/24HR Place onto the skin. 01/17/22  Yes [provider]  eszopiclone (LUNESTA) 1 MG TABS tablet Take 1 mg by mouth at bedtime. 05/02/22  Yes [provider]  fluticasone (FLONASE) 50 MCG/ACT nasal spray Place into both nostrils. 03/04/22  Yes [provider]  folic acid (FOLVITE) 354 MCG tablet    Yes [provider]  furosemide (LASIX) 20 MG tablet Take 20 mg by mouth 2 (two) times daily. 03/29/22  Yes [provider]  Niacinamide-Zn-Cu-Methfo-Se-Cr (NICOTINAMIDE PO) Take by mouth.   Yes [provider]  OVER THE COUNTER MEDICATION VEIN FORMULA 1 CAPSULE DAILY   Yes [provider]  Probiotic Product (PROBIOTIC-10 PO) Probiotic   Yes [provider]  simvastatin (ZOCOR) 20 MG tablet Take 20 mg by mouth at bedtime. 03/11/22  Yes [provider]  Testosterone 1.62 % GEL APPLY A PEA-SIZED AMOUNT TO INNER THIGH AT BEDTIME. 04/22/22  Yes [provider]  Vitamin A 2400 MCG (8000 UT) TABS    Yes [provider]  vitamin B-12 (CYANOCOBALAMIN) 500 MCG tablet    Yes [provider]  aspirin EC 81 MG tablet Take by mouth.    [provider]    Current Outpatient Medications  Medication Sig Dispense Refill   Biotin 10 MG CAPS biotin 10,000 mcg capsule     cetirizine (ZYRTEC) 10 MG tablet      Cholecalciferol 25 MCG (1000 UT) tablet Take by mouth.     Coenzyme Q10 (CO Q-10) 50 MG CAPS      escitalopram (LEXAPRO) 10 MG tablet Take 10 mg by mouth daily.     estradiol (VIVELLE-DOT) 0.025 MG/24HR Place onto the skin.     eszopiclone (LUNESTA) 1 MG TABS tablet Take  1 mg by mouth at bedtime.     fluticasone (FLONASE) 50 MCG/ACT nasal spray Place into both nostrils.     folic acid (FOLVITE) 453 MCG tablet      furosemide (LASIX) 20 MG tablet Take 20 mg by mouth 2 (two) times daily.     Niacinamide-Zn-Cu-Methfo-Se-Cr (NICOTINAMIDE PO) Take by mouth.     OVER THE COUNTER MEDICATION VEIN FORMULA 1 CAPSULE DAILY     Probiotic Product (PROBIOTIC-10 PO) Probiotic     simvastatin (ZOCOR) 20 MG tablet Take 20 mg by mouth at bedtime.     Testosterone 1.62 % GEL APPLY A PEA-SIZED AMOUNT TO INNER THIGH AT BEDTIME.     Vitamin A 2400 MCG (8000 UT) TABS      vitamin B-12 (CYANOCOBALAMIN) 500 MCG tablet      aspirin EC 81 MG tablet Take by mouth.     Current Facility-Administered Medications  Medication Dose Route Frequency Provider Last Rate Last Admin   0.9 %  sodium chloride infusion  500 mL Intravenous Once Jackquline Denmark, MD         Allergies as of 08/19/2022 - Review Complete 08/19/2022  Allergen Reaction Noted   Cephalexin Other (See Comments) and Rash 02/25/2016   Mometasone Other (See Comments) 03/04/2022   Amoxicillin Diarrhea 05/06/2022   Sulfa antibiotics  05/06/2022    Family History  Problem Relation Age of Onset   Breast cancer Neg Hx    Colon cancer Neg Hx    Colon polyps Neg Hx    Esophageal cancer Neg Hx    Rectal cancer Neg Hx    Stomach cancer Neg Hx     Social History   Socioeconomic History   Marital status: Married    Spouse name: Not on file   Number of children: Not on file   Years of education: Not on file   Highest education level: Not on file  Occupational History   Not on file  Tobacco Use   Smoking status: Never   Smokeless tobacco: Never  Vaping Use   Vaping Use: Never used  Substance and Sexual Activity   Alcohol use: Never   Drug use: Never   Sexual activity: Yes    Birth control/protection: Post-menopausal  Other Topics Concern   Not on file  Social History Narrative   Not on file   Social Determinants of Health   Financial Resource Strain: Not on file  Food Insecurity: Not on file  Transportation Needs: Not on file  Physical Activity: Not on file  Stress: Not on file  Social Connections: Not on file  Intimate Partner Violence: Not on file    Review of Systems: Positive for None All other review of systems negative except as mentioned in the HPI.  Physical Exam: Vital signs in last 24 hours: '@VSRANGES'$ @   General:   Alert,  Well-developed, well-nourished, pleasant and cooperative in NAD Lungs:  Clear throughout to auscultation.   Heart:  Regular rate and rhythm; no murmurs, clicks, rubs,  or gallops. Abdomen:  Soft, nontender and nondistended. Normal bowel sounds.   Neuro/Psych:  Alert and cooperative. Normal mood and affect. A and O x 3    No significant changes were identified.  The patient continues to be an appropriate candidate for the  planned procedure and anesthesia.   Carmell Austria, MD. [facility] 08/19/2022 9:31 AM@

## 2022-08-19 NOTE — Patient Instructions (Signed)
Handouts Provided:  High Fiber Diet and Diverticulosis  YOU HAD AN ENDOSCOPIC PROCEDURE TODAY AT [facility]:   Refer to the procedure report that was given to you for any specific questions about what was found during the examination.  If the procedure report does not answer your questions, please call your gastroenterologist to clarify.  If you requested that your care partner not be given the details of your procedure findings, then the procedure report has been included in a sealed envelope for you to review at your convenience later.  YOU SHOULD EXPECT: Some feelings of bloating in the abdomen. Passage of more gas than usual.  Walking can help get rid of the air that was put into your GI tract during the procedure and reduce the bloating. If you had a lower endoscopy (such as a colonoscopy or flexible sigmoidoscopy) you may notice spotting of blood in your stool or on the toilet paper. If you underwent a bowel prep for your procedure, you may not have a normal bowel movement for a few days.  Please Note:  You might notice some irritation and congestion in your nose or some drainage.  This is from the oxygen used during your procedure.  There is no need for concern and it should clear up in a day or so.  SYMPTOMS TO REPORT IMMEDIATELY:  Following lower endoscopy (colonoscopy or flexible sigmoidoscopy):  Excessive amounts of blood in the stool  Significant tenderness or worsening of abdominal pains  Swelling of the abdomen that is new, acute  Fever of 100F or higher  For urgent or emergent issues, a gastroenterologist can be reached at any hour by calling [phone]. Do not use MyChart messaging for urgent concerns.    DIET:  We do recommend a small meal at first, but then you may proceed to your regular diet.  Drink plenty of fluids but you should avoid alcoholic beverages for 24 hours.  ACTIVITY:  You should plan to take it easy for the rest of today and you should  NOT DRIVE or use heavy machinery until tomorrow (because of the sedation medicines used during the test).    FOLLOW UP: Our staff will call the number listed on your records the next business day following your procedure.  We will call around 7:15- 8:00 am to check on you and address any questions or concerns that you may have regarding the information given to you following your procedure. If we do not reach you, we will leave a message.  If you develop any symptoms (ie: fever, flu-like symptoms, shortness of breath, cough etc.) before then, please call [phone].  If you test positive for Covid 19 in the 2 weeks post procedure, please call and report this information to [place].    If any biopsies were taken you will be contacted by phone or by letter within the next 1-3 weeks.  Please call us at [phone] if you have not heard about the biopsies in 3 weeks.    SIGNATURES/CONFIDENTIALITY: You and/or your care partner have signed paperwork which will be entered into your electronic medical record.  These signatures attest to the fact that that the information above on your After Visit Summary has been reviewed and is understood.  Full responsibility of the confidentiality of this discharge information lies with you and/or your care-partner.

## 2022-08-23 ENCOUNTER — Telehealth: Payer: Self-pay

## 2022-08-23 NOTE — Telephone Encounter (Signed)
Left message on answering machine. 

## 2022-08-30 DIAGNOSIS — H338 Other retinal detachments: Secondary | ICD-10-CM | POA: Diagnosis not present

## 2022-08-30 DIAGNOSIS — H52223 Regular astigmatism, bilateral: Secondary | ICD-10-CM | POA: Diagnosis not present

## 2022-08-30 DIAGNOSIS — H40059 Ocular hypertension, unspecified eye: Secondary | ICD-10-CM | POA: Diagnosis not present

## 2022-08-30 DIAGNOSIS — H5212 Myopia, left eye: Secondary | ICD-10-CM | POA: Diagnosis not present

## 2022-08-30 DIAGNOSIS — H25813 Combined forms of age-related cataract, bilateral: Secondary | ICD-10-CM | POA: Diagnosis not present

## 2022-09-05 DIAGNOSIS — G4733 Obstructive sleep apnea (adult) (pediatric): Secondary | ICD-10-CM | POA: Diagnosis not present

## 2022-09-05 DIAGNOSIS — G4721 Circadian rhythm sleep disorder, delayed sleep phase type: Secondary | ICD-10-CM | POA: Diagnosis not present

## 2022-09-17 DIAGNOSIS — Z23 Encounter for immunization: Secondary | ICD-10-CM | POA: Diagnosis not present

## 2022-09-28 DIAGNOSIS — C44529 Squamous cell carcinoma of skin of other part of trunk: Secondary | ICD-10-CM | POA: Diagnosis not present

## 2022-10-28 DIAGNOSIS — R600 Localized edema: Secondary | ICD-10-CM | POA: Diagnosis not present

## 2022-10-28 DIAGNOSIS — I83892 Varicose veins of left lower extremities with other complications: Secondary | ICD-10-CM | POA: Diagnosis not present

## 2022-10-28 DIAGNOSIS — I890 Lymphedema, not elsewhere classified: Secondary | ICD-10-CM | POA: Diagnosis not present

## 2022-10-28 DIAGNOSIS — I8312 Varicose veins of left lower extremity with inflammation: Secondary | ICD-10-CM | POA: Diagnosis not present

## 2022-11-25 DIAGNOSIS — L578 Other skin changes due to chronic exposure to nonionizing radiation: Secondary | ICD-10-CM | POA: Diagnosis not present

## 2022-11-25 DIAGNOSIS — D225 Melanocytic nevi of trunk: Secondary | ICD-10-CM | POA: Diagnosis not present

## 2022-11-25 DIAGNOSIS — L814 Other melanin hyperpigmentation: Secondary | ICD-10-CM | POA: Diagnosis not present

## 2022-11-25 DIAGNOSIS — C44529 Squamous cell carcinoma of skin of other part of trunk: Secondary | ICD-10-CM | POA: Diagnosis not present

## 2022-11-25 DIAGNOSIS — L570 Actinic keratosis: Secondary | ICD-10-CM | POA: Diagnosis not present

## 2022-12-02 DIAGNOSIS — I87392 Chronic venous hypertension (idiopathic) with other complications of left lower extremity: Secondary | ICD-10-CM | POA: Diagnosis not present

## 2022-12-02 DIAGNOSIS — I83892 Varicose veins of left lower extremities with other complications: Secondary | ICD-10-CM | POA: Diagnosis not present

## 2022-12-02 DIAGNOSIS — Z01419 Encounter for gynecological examination (general) (routine) without abnormal findings: Secondary | ICD-10-CM | POA: Diagnosis not present

## 2022-12-02 DIAGNOSIS — Z90710 Acquired absence of both cervix and uterus: Secondary | ICD-10-CM | POA: Diagnosis not present

## 2022-12-09 DIAGNOSIS — L570 Actinic keratosis: Secondary | ICD-10-CM | POA: Diagnosis not present

## 2022-12-14 DIAGNOSIS — G4733 Obstructive sleep apnea (adult) (pediatric): Secondary | ICD-10-CM | POA: Diagnosis not present

## 2023-03-10 ENCOUNTER — Other Ambulatory Visit: Payer: Self-pay | Admitting: Family Medicine

## 2023-03-10 DIAGNOSIS — G4733 Obstructive sleep apnea (adult) (pediatric): Secondary | ICD-10-CM | POA: Diagnosis not present

## 2023-03-10 DIAGNOSIS — I890 Lymphedema, not elsewhere classified: Secondary | ICD-10-CM | POA: Diagnosis not present

## 2023-03-10 DIAGNOSIS — E782 Mixed hyperlipidemia: Secondary | ICD-10-CM | POA: Diagnosis not present

## 2023-03-10 DIAGNOSIS — N951 Menopausal and female climacteric states: Secondary | ICD-10-CM | POA: Diagnosis not present

## 2023-03-10 DIAGNOSIS — M899 Disorder of bone, unspecified: Secondary | ICD-10-CM | POA: Diagnosis not present

## 2023-03-10 DIAGNOSIS — I10 Essential (primary) hypertension: Secondary | ICD-10-CM | POA: Diagnosis not present

## 2023-03-10 DIAGNOSIS — J309 Allergic rhinitis, unspecified: Secondary | ICD-10-CM | POA: Diagnosis not present

## 2023-03-10 DIAGNOSIS — F411 Generalized anxiety disorder: Secondary | ICD-10-CM | POA: Diagnosis not present

## 2023-03-10 DIAGNOSIS — Z1231 Encounter for screening mammogram for malignant neoplasm of breast: Secondary | ICD-10-CM

## 2023-03-10 DIAGNOSIS — M2010 Hallux valgus (acquired), unspecified foot: Secondary | ICD-10-CM | POA: Diagnosis not present

## 2023-03-10 DIAGNOSIS — Z85828 Personal history of other malignant neoplasm of skin: Secondary | ICD-10-CM | POA: Diagnosis not present

## 2023-03-10 DIAGNOSIS — N952 Postmenopausal atrophic vaginitis: Secondary | ICD-10-CM | POA: Diagnosis not present

## 2023-03-10 DIAGNOSIS — Z0000 Encounter for general adult medical examination without abnormal findings: Secondary | ICD-10-CM | POA: Diagnosis not present

## 2023-03-15 DIAGNOSIS — G4733 Obstructive sleep apnea (adult) (pediatric): Secondary | ICD-10-CM | POA: Diagnosis not present

## 2023-03-15 DIAGNOSIS — G4721 Circadian rhythm sleep disorder, delayed sleep phase type: Secondary | ICD-10-CM | POA: Diagnosis not present

## 2023-03-31 DIAGNOSIS — C44729 Squamous cell carcinoma of skin of left lower limb, including hip: Secondary | ICD-10-CM | POA: Diagnosis not present

## 2023-03-31 DIAGNOSIS — L570 Actinic keratosis: Secondary | ICD-10-CM | POA: Diagnosis not present

## 2023-03-31 DIAGNOSIS — L578 Other skin changes due to chronic exposure to nonionizing radiation: Secondary | ICD-10-CM | POA: Diagnosis not present

## 2023-04-18 DIAGNOSIS — I872 Venous insufficiency (chronic) (peripheral): Secondary | ICD-10-CM | POA: Diagnosis not present

## 2023-04-25 DIAGNOSIS — H25013 Cortical age-related cataract, bilateral: Secondary | ICD-10-CM | POA: Diagnosis not present

## 2023-04-25 DIAGNOSIS — H18413 Arcus senilis, bilateral: Secondary | ICD-10-CM | POA: Diagnosis not present

## 2023-04-25 DIAGNOSIS — H35372 Puckering of macula, left eye: Secondary | ICD-10-CM | POA: Diagnosis not present

## 2023-04-25 DIAGNOSIS — H2513 Age-related nuclear cataract, bilateral: Secondary | ICD-10-CM | POA: Diagnosis not present

## 2023-04-25 DIAGNOSIS — H2512 Age-related nuclear cataract, left eye: Secondary | ICD-10-CM | POA: Diagnosis not present

## 2023-04-25 DIAGNOSIS — H25043 Posterior subcapsular polar age-related cataract, bilateral: Secondary | ICD-10-CM | POA: Diagnosis not present

## 2023-04-28 ENCOUNTER — Ambulatory Visit
Admission: RE | Admit: 2023-04-28 | Discharge: 2023-04-28 | Disposition: A | Discharge status: Home / Self Care | Payer: PPO | Source: Ambulatory Visit | Attending: Family Medicine | Admitting: Family Medicine

## 2023-04-28 DIAGNOSIS — Z1231 Encounter for screening mammogram for malignant neoplasm of breast: Secondary | ICD-10-CM

## 2023-05-04 DIAGNOSIS — I890 Lymphedema, not elsewhere classified: Secondary | ICD-10-CM | POA: Diagnosis not present

## 2023-06-04 DIAGNOSIS — I890 Lymphedema, not elsewhere classified: Secondary | ICD-10-CM | POA: Diagnosis not present

## 2023-06-06 DIAGNOSIS — I872 Venous insufficiency (chronic) (peripheral): Secondary | ICD-10-CM | POA: Diagnosis not present

## 2023-06-23 DIAGNOSIS — I83892 Varicose veins of left lower extremities with other complications: Secondary | ICD-10-CM | POA: Diagnosis not present

## 2023-06-23 DIAGNOSIS — I8312 Varicose veins of left lower extremity with inflammation: Secondary | ICD-10-CM | POA: Diagnosis not present

## 2023-06-23 DIAGNOSIS — I890 Lymphedema, not elsewhere classified: Secondary | ICD-10-CM | POA: Diagnosis not present

## 2023-06-23 DIAGNOSIS — R600 Localized edema: Secondary | ICD-10-CM | POA: Diagnosis not present

## 2023-06-30 DIAGNOSIS — L300 Nummular dermatitis: Secondary | ICD-10-CM | POA: Diagnosis not present

## 2023-06-30 DIAGNOSIS — L578 Other skin changes due to chronic exposure to nonionizing radiation: Secondary | ICD-10-CM | POA: Diagnosis not present

## 2023-06-30 DIAGNOSIS — C44729 Squamous cell carcinoma of skin of left lower limb, including hip: Secondary | ICD-10-CM | POA: Diagnosis not present

## 2023-06-30 DIAGNOSIS — L570 Actinic keratosis: Secondary | ICD-10-CM | POA: Diagnosis not present

## 2023-07-04 DIAGNOSIS — I890 Lymphedema, not elsewhere classified: Secondary | ICD-10-CM | POA: Diagnosis not present

## 2023-08-02 DIAGNOSIS — H2512 Age-related nuclear cataract, left eye: Secondary | ICD-10-CM | POA: Diagnosis not present

## 2023-08-03 DIAGNOSIS — H2511 Age-related nuclear cataract, right eye: Secondary | ICD-10-CM | POA: Diagnosis not present

## 2023-08-04 DIAGNOSIS — I890 Lymphedema, not elsewhere classified: Secondary | ICD-10-CM | POA: Diagnosis not present

## 2023-08-30 DIAGNOSIS — H2511 Age-related nuclear cataract, right eye: Secondary | ICD-10-CM | POA: Diagnosis not present

## 2023-09-04 DIAGNOSIS — I890 Lymphedema, not elsewhere classified: Secondary | ICD-10-CM | POA: Diagnosis not present

## 2023-09-12 DIAGNOSIS — G4721 Circadian rhythm sleep disorder, delayed sleep phase type: Secondary | ICD-10-CM | POA: Diagnosis not present

## 2023-09-12 DIAGNOSIS — G4733 Obstructive sleep apnea (adult) (pediatric): Secondary | ICD-10-CM | POA: Diagnosis not present

## 2023-10-04 DIAGNOSIS — I890 Lymphedema, not elsewhere classified: Secondary | ICD-10-CM | POA: Diagnosis not present

## 2023-10-06 DIAGNOSIS — L814 Other melanin hyperpigmentation: Secondary | ICD-10-CM | POA: Diagnosis not present

## 2023-10-06 DIAGNOSIS — L820 Inflamed seborrheic keratosis: Secondary | ICD-10-CM | POA: Diagnosis not present

## 2023-10-06 DIAGNOSIS — L649 Androgenic alopecia, unspecified: Secondary | ICD-10-CM | POA: Diagnosis not present

## 2023-10-06 DIAGNOSIS — C44729 Squamous cell carcinoma of skin of left lower limb, including hip: Secondary | ICD-10-CM | POA: Diagnosis not present

## 2023-10-06 DIAGNOSIS — L578 Other skin changes due to chronic exposure to nonionizing radiation: Secondary | ICD-10-CM | POA: Diagnosis not present

## 2023-11-04 DIAGNOSIS — I890 Lymphedema, not elsewhere classified: Secondary | ICD-10-CM | POA: Diagnosis not present

## 2023-12-04 DIAGNOSIS — I890 Lymphedema, not elsewhere classified: Secondary | ICD-10-CM | POA: Diagnosis not present

## 2023-12-05 DIAGNOSIS — Z961 Presence of intraocular lens: Secondary | ICD-10-CM | POA: Diagnosis not present

## 2023-12-05 DIAGNOSIS — H26493 Other secondary cataract, bilateral: Secondary | ICD-10-CM | POA: Diagnosis not present

## 2023-12-05 DIAGNOSIS — H18413 Arcus senilis, bilateral: Secondary | ICD-10-CM | POA: Diagnosis not present

## 2023-12-05 DIAGNOSIS — H26492 Other secondary cataract, left eye: Secondary | ICD-10-CM | POA: Diagnosis not present

## 2023-12-05 DIAGNOSIS — H35372 Puckering of macula, left eye: Secondary | ICD-10-CM | POA: Diagnosis not present

## 2023-12-12 DIAGNOSIS — H2512 Age-related nuclear cataract, left eye: Secondary | ICD-10-CM | POA: Diagnosis not present

## 2023-12-12 DIAGNOSIS — Z9842 Cataract extraction status, left eye: Secondary | ICD-10-CM | POA: Diagnosis not present

## 2023-12-12 DIAGNOSIS — Z961 Presence of intraocular lens: Secondary | ICD-10-CM | POA: Diagnosis not present

## 2024-01-01 IMAGING — MG MM DIGITAL SCREENING BILAT W/ TOMO AND CAD
8 series · 8 of 24 positions shown · non-contrast
Comparison: Previous exam(s).

CLINICAL DATA: Screening.

EXAM:
DIGITAL SCREENING BILATERAL MAMMOGRAM WITH TOMOSYNTHESIS AND CAD
TECHNIQUE: Bilateral screening digital craniocaudal and mediolateral oblique
mammograms were obtained. Bilateral screening digital breast
tomosynthesis was performed. The images were evaluated with
computer-aided detection.

[L MLO synth-2D]
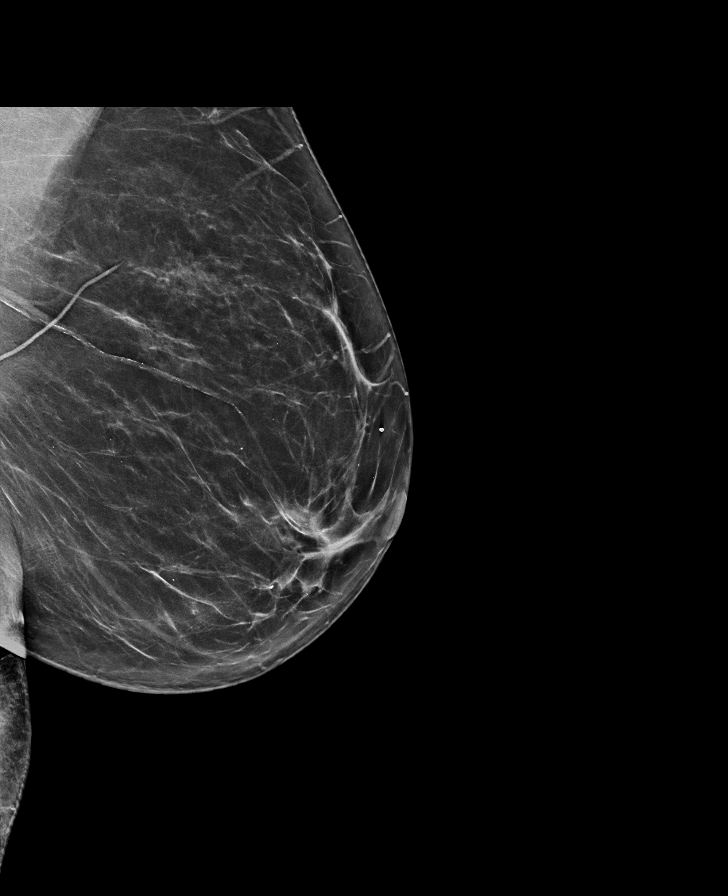

[L CC synth-2D]
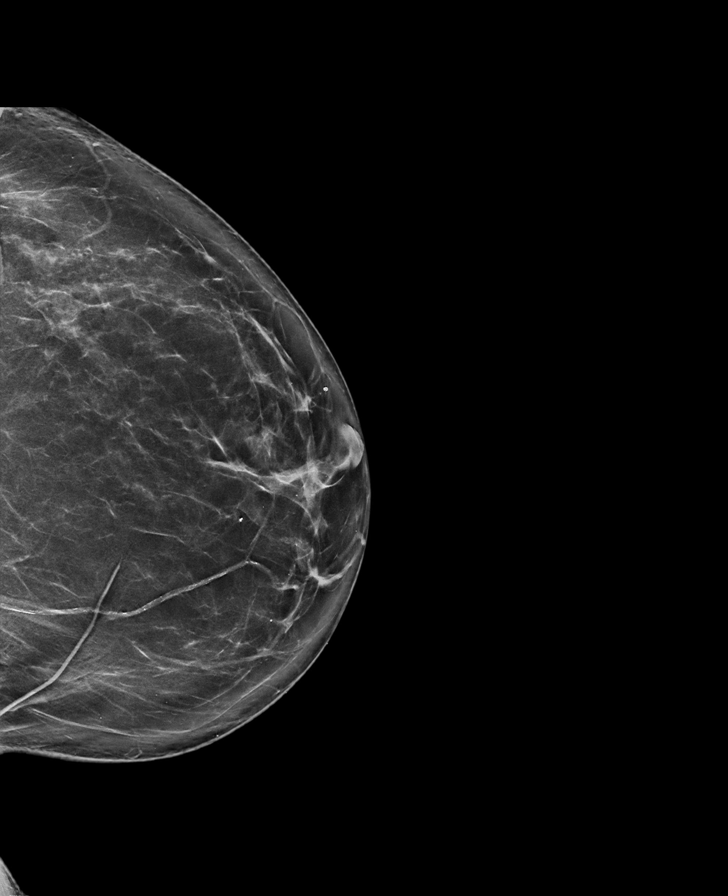

[R CC synth-2D]
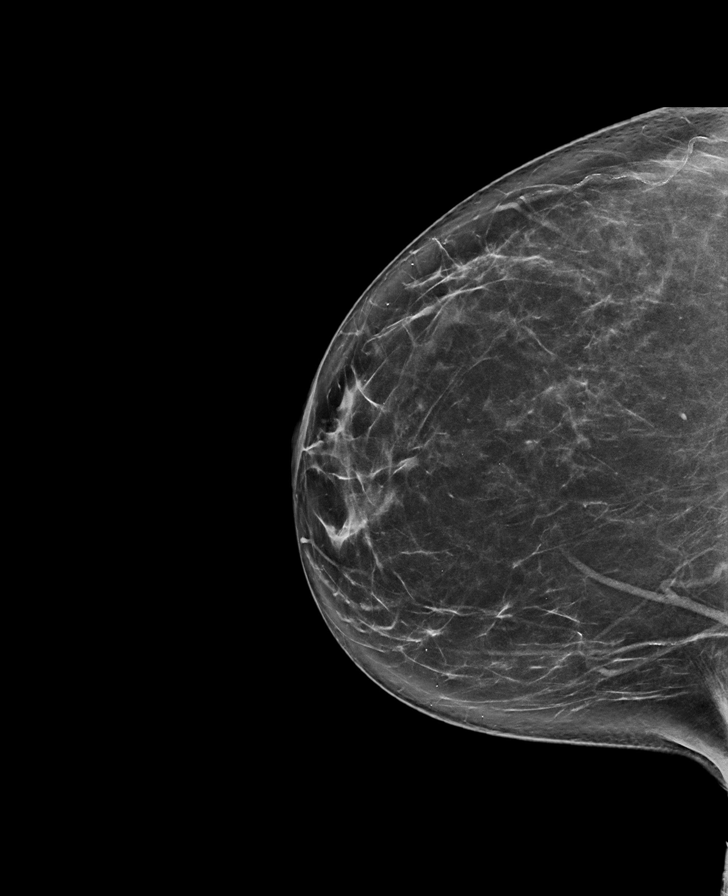

[R MLO synth-2D]
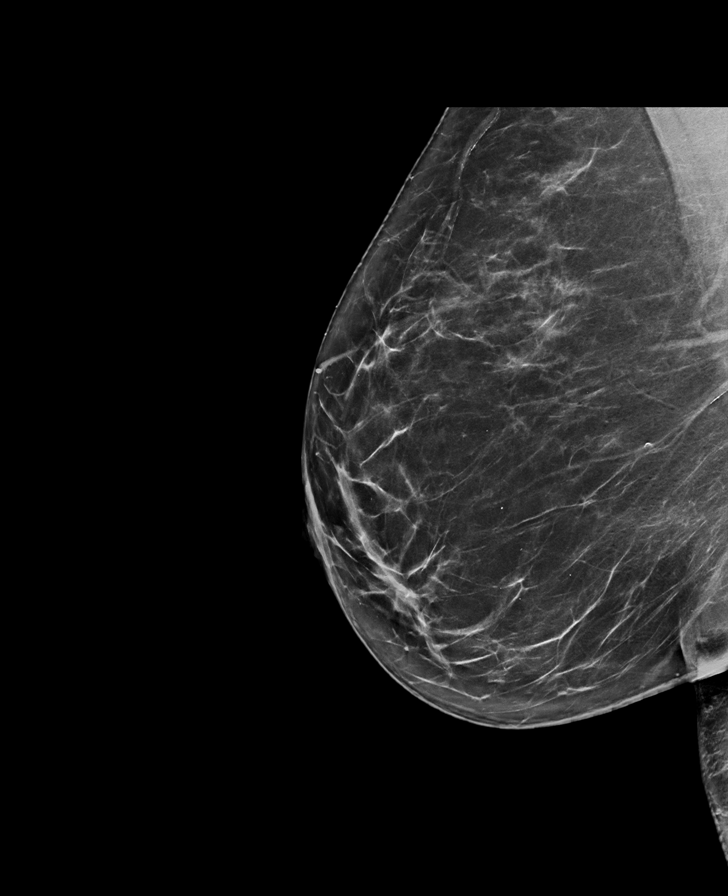

[L MLO tomo · tomo slice 41/80.0]
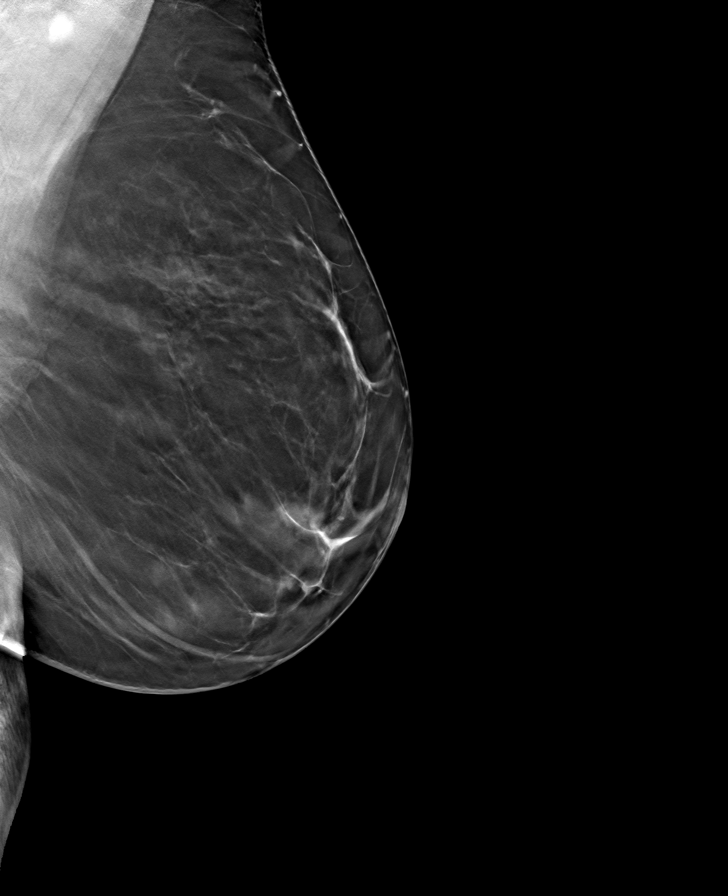

[R CC tomo · tomo slice 41/80.0]
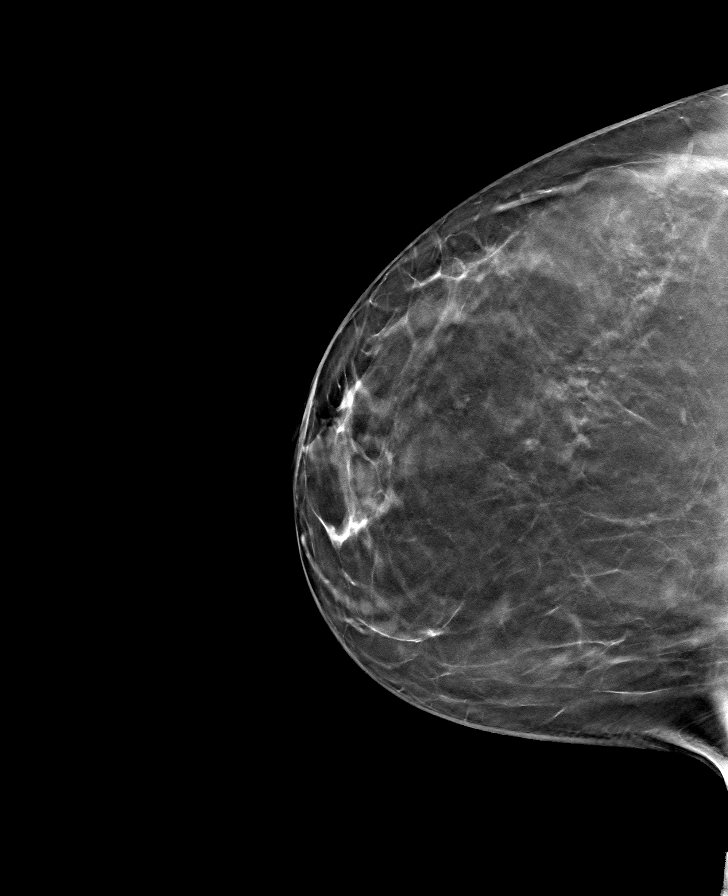

[L CC tomo · tomo slice 41/82.0]
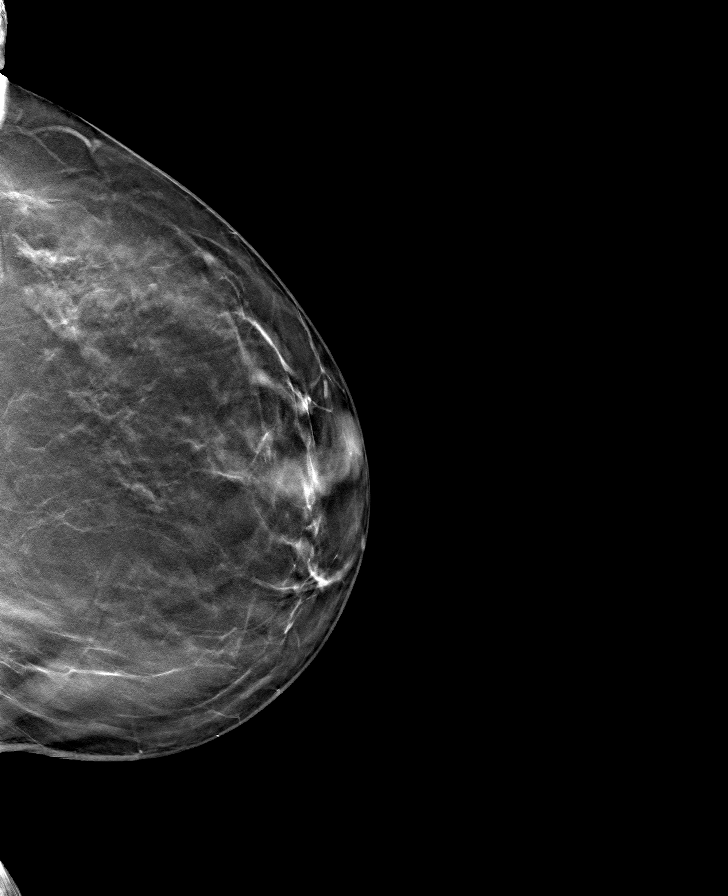

[R MLO tomo · tomo slice 43/84.0]
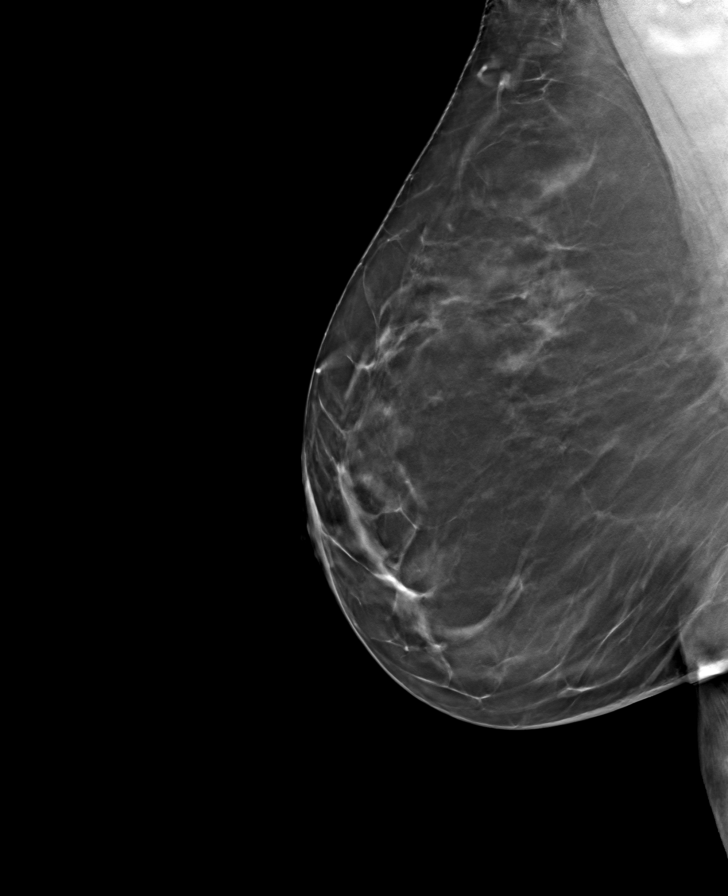

[8 of 24 positions shown; findings below may reference images not displayed]

ACR Breast Density Category b: There are scattered areas of
fibroglandular density.
FINDINGS: There are no findings suspicious for malignancy.
IMPRESSION: No mammographic evidence of malignancy. A result letter of this
screening mammogram will be mailed directly to the patient.

RECOMMENDATION:
Screening mammogram in one year. (Code:51-O-LD2)

BI-RADS CATEGORY  1: Negative.

## 2024-02-27 DIAGNOSIS — H26491 Other secondary cataract, right eye: Secondary | ICD-10-CM | POA: Diagnosis not present

## 2024-03-03 DIAGNOSIS — I890 Lymphedema, not elsewhere classified: Secondary | ICD-10-CM | POA: Diagnosis not present

## 2024-03-07 DIAGNOSIS — H53143 Visual discomfort, bilateral: Secondary | ICD-10-CM | POA: Diagnosis not present

## 2024-03-12 ENCOUNTER — Other Ambulatory Visit: Payer: Self-pay | Admitting: Family Medicine

## 2024-03-12 DIAGNOSIS — I890 Lymphedema, not elsewhere classified: Secondary | ICD-10-CM | POA: Diagnosis not present

## 2024-03-12 DIAGNOSIS — E782 Mixed hyperlipidemia: Secondary | ICD-10-CM | POA: Diagnosis not present

## 2024-03-12 DIAGNOSIS — N951 Menopausal and female climacteric states: Secondary | ICD-10-CM | POA: Diagnosis not present

## 2024-03-12 DIAGNOSIS — I10 Essential (primary) hypertension: Secondary | ICD-10-CM | POA: Diagnosis not present

## 2024-03-12 DIAGNOSIS — Z131 Encounter for screening for diabetes mellitus: Secondary | ICD-10-CM | POA: Diagnosis not present

## 2024-03-12 DIAGNOSIS — Z85828 Personal history of other malignant neoplasm of skin: Secondary | ICD-10-CM | POA: Diagnosis not present

## 2024-03-12 DIAGNOSIS — Z0000 Encounter for general adult medical examination without abnormal findings: Secondary | ICD-10-CM | POA: Diagnosis not present

## 2024-03-12 DIAGNOSIS — N952 Postmenopausal atrophic vaginitis: Secondary | ICD-10-CM | POA: Diagnosis not present

## 2024-03-12 DIAGNOSIS — F411 Generalized anxiety disorder: Secondary | ICD-10-CM | POA: Diagnosis not present

## 2024-03-12 DIAGNOSIS — J309 Allergic rhinitis, unspecified: Secondary | ICD-10-CM | POA: Diagnosis not present

## 2024-03-12 DIAGNOSIS — M899 Disorder of bone, unspecified: Secondary | ICD-10-CM | POA: Diagnosis not present

## 2024-03-12 DIAGNOSIS — G4733 Obstructive sleep apnea (adult) (pediatric): Secondary | ICD-10-CM | POA: Diagnosis not present

## 2024-03-12 DIAGNOSIS — E2839 Other primary ovarian failure: Secondary | ICD-10-CM

## 2024-03-20 DIAGNOSIS — G4721 Circadian rhythm sleep disorder, delayed sleep phase type: Secondary | ICD-10-CM | POA: Diagnosis not present

## 2024-03-20 DIAGNOSIS — G4733 Obstructive sleep apnea (adult) (pediatric): Secondary | ICD-10-CM | POA: Diagnosis not present

## 2024-03-20 DIAGNOSIS — I10 Essential (primary) hypertension: Secondary | ICD-10-CM | POA: Diagnosis not present

## 2024-03-27 ENCOUNTER — Other Ambulatory Visit: Payer: Self-pay | Admitting: Family Medicine

## 2024-03-27 DIAGNOSIS — Z1231 Encounter for screening mammogram for malignant neoplasm of breast: Secondary | ICD-10-CM

## 2024-04-03 DIAGNOSIS — I890 Lymphedema, not elsewhere classified: Secondary | ICD-10-CM | POA: Diagnosis not present

## 2024-04-30 ENCOUNTER — Ambulatory Visit
Admission: RE | Admit: 2024-04-30 | Discharge: 2024-04-30 | Disposition: A | Discharge status: Home / Self Care | Source: Ambulatory Visit | Attending: Family Medicine | Admitting: Family Medicine

## 2024-04-30 DIAGNOSIS — Z1231 Encounter for screening mammogram for malignant neoplasm of breast: Secondary | ICD-10-CM

## 2024-05-03 DIAGNOSIS — I890 Lymphedema, not elsewhere classified: Secondary | ICD-10-CM | POA: Diagnosis not present

## 2024-05-24 ENCOUNTER — Ambulatory Visit (HOSPITAL_BASED_OUTPATIENT_CLINIC_OR_DEPARTMENT_OTHER)
Admission: RE | Admit: 2024-05-24 | Discharge: 2024-05-24 | Disposition: A | Discharge status: Home / Self Care | Source: Ambulatory Visit | Attending: Family Medicine | Admitting: Family Medicine

## 2024-05-24 DIAGNOSIS — Z13820 Encounter for screening for osteoporosis: Secondary | ICD-10-CM | POA: Diagnosis not present

## 2024-05-24 DIAGNOSIS — E2839 Other primary ovarian failure: Secondary | ICD-10-CM | POA: Diagnosis not present

## 2024-05-24 DIAGNOSIS — Z780 Asymptomatic menopausal state: Secondary | ICD-10-CM

## 2024-07-26 DIAGNOSIS — G4733 Obstructive sleep apnea (adult) (pediatric): Secondary | ICD-10-CM | POA: Diagnosis not present

## 2024-07-26 DIAGNOSIS — R400 Somnolence: Secondary | ICD-10-CM | POA: Diagnosis not present

## 2024-08-14 DIAGNOSIS — L578 Other skin changes due to chronic exposure to nonionizing radiation: Secondary | ICD-10-CM | POA: Diagnosis not present

## 2024-08-14 DIAGNOSIS — C44729 Squamous cell carcinoma of skin of left lower limb, including hip: Secondary | ICD-10-CM | POA: Diagnosis not present

## 2024-08-14 DIAGNOSIS — C44519 Basal cell carcinoma of skin of other part of trunk: Secondary | ICD-10-CM | POA: Diagnosis not present

## 2024-08-14 DIAGNOSIS — L814 Other melanin hyperpigmentation: Secondary | ICD-10-CM | POA: Diagnosis not present

## 2024-08-14 DIAGNOSIS — L570 Actinic keratosis: Secondary | ICD-10-CM | POA: Diagnosis not present

## 2024-08-31 DIAGNOSIS — C44519 Basal cell carcinoma of skin of other part of trunk: Secondary | ICD-10-CM | POA: Diagnosis not present

## 2024-09-27 DIAGNOSIS — Z23 Encounter for immunization: Secondary | ICD-10-CM | POA: Diagnosis not present

## 2024-09-27 DIAGNOSIS — R7303 Prediabetes: Secondary | ICD-10-CM | POA: Diagnosis not present

## 2024-10-03 DIAGNOSIS — G4700 Insomnia, unspecified: Secondary | ICD-10-CM | POA: Diagnosis not present

## 2024-10-03 DIAGNOSIS — G4733 Obstructive sleep apnea (adult) (pediatric): Secondary | ICD-10-CM | POA: Diagnosis not present

## 2024-10-04 DIAGNOSIS — I83891 Varicose veins of right lower extremities with other complications: Secondary | ICD-10-CM | POA: Diagnosis not present

## 2024-10-04 DIAGNOSIS — I87393 Chronic venous hypertension (idiopathic) with other complications of bilateral lower extremity: Secondary | ICD-10-CM | POA: Diagnosis not present

## 2024-10-04 DIAGNOSIS — I8312 Varicose veins of left lower extremity with inflammation: Secondary | ICD-10-CM | POA: Diagnosis not present

## 2024-10-04 DIAGNOSIS — I8311 Varicose veins of right lower extremity with inflammation: Secondary | ICD-10-CM | POA: Diagnosis not present

## 2024-10-04 DIAGNOSIS — R600 Localized edema: Secondary | ICD-10-CM | POA: Diagnosis not present

## 2024-10-08 DIAGNOSIS — R400 Somnolence: Secondary | ICD-10-CM | POA: Diagnosis not present

## 2024-11-05 DIAGNOSIS — I83891 Varicose veins of right lower extremities with other complications: Secondary | ICD-10-CM | POA: Diagnosis not present

## 2024-11-08 DIAGNOSIS — R400 Somnolence: Secondary | ICD-10-CM | POA: Diagnosis not present

## 2024-11-13 ENCOUNTER — Other Ambulatory Visit

## 2024-12-11 ENCOUNTER — Ambulatory Visit: Admitting: Podiatry

## 2024-12-11 ENCOUNTER — Ambulatory Visit (INDEPENDENT_AMBULATORY_CARE_PROVIDER_SITE_OTHER)

## 2024-12-11 DIAGNOSIS — M21612 Bunion of left foot: Secondary | ICD-10-CM

## 2024-12-11 DIAGNOSIS — M2012 Hallux valgus (acquired), left foot: Secondary | ICD-10-CM | POA: Diagnosis not present

## 2024-12-11 DIAGNOSIS — Q66222 Congenital metatarsus adductus, left foot: Secondary | ICD-10-CM

## 2024-12-11 NOTE — Progress Notes (Signed)
 "   Chief Complaint  Patient presents with   Bunions    Left foot bunion, pain off and on for 3 years.  Would like both conservative and sx options.  Not diabetic, No anti coag.    Discussed the use of AI scribe software for clinical note transcription with the patient, who gave verbal consent to proceed.  History of Present Illness Tami Stewart is a 73 year old female with left foot bunion, osteoarthritis, metatarsus adductus, and lymphedema who presents for evaluation of worsening left foot bunion pain.  She has experienced intermittent left foot bunion pain for several years, with increased intensity since her last visit. Pain is localized to the medial aspect of the left foot at the bunion and occasionally involves the first metatarsophalangeal joint. She denies any catching sensation in the joint. Pain radiates toward the hallux with pressure near the bunion and is aggravated by shoe wear. She denies pain on the lateral or posterior aspects of the foot.  She has lymphedema of the left lower extremity, managed with compression stockings. 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 She denies any history of knee or hip arthroplasty.   Past Medical History:  Diagnosis Date   Adenomatous polyps    Allergy    Anemia    PAST HX   Anxiety    Cancer (HCC)    SKIN CANCER   Cataract    LEFT EYE   GERD (gastroesophageal reflux disease)    Hyperlipidemia    CONTROLLED ON MEDS   Hypertension    CONTROLLED ON MEDS   Perforated sigmoid colon (HCC)    BRODIE PER PT   Sleep apnea    USES CPAP   Past Surgical History:  Procedure Laterality Date   BUNIONECTOMY Right    CHOLECYSTECTOMY     COLONOSCOPY     MUSCLE TENDON REPAIR     LEFT FOOT   PARTIAL HYSTERECTOMY     POLYPECTOMY     RETINAL TEAR REPAIR CRYOTHERAPY Left    TUBAL LIGATION     Allergies[1]  Physical Exam EXTREMITIES: Pain near  bunion on left foot. Sensation radiating towards toe when tapping near bunion.  Decreased dorsiflexion of left big toe at first MPJ. Pain in small joint of left foot. Pain on medial side first MPJ when toe is straightened. No pain on lateral side of left foot. Circulation in left foot normal.  Epicritic sensation intact.   Results Radiology Left foot x-ray (12/11/2024): Metatarsus adductus with medial angulation of metatarsals 1 2 and 3; joint space narrowing at the first metatarsophalangeal joint with cartilage thinning and arthritic changes; prominent medial eminence at the first metatarsal head with mild dorsal osteophyte formation; mild elevatus of the first metatarsal; normal sesamoid position; no abnormal osseous structures (Independently interpreted)    Assessment/Plan of Care: 1. Bunion, left foot   2. Metatarsus adductus of left foot   3. Hallux valgus, left    Assessment & Plan Bunion of left foot with associated osteoarthritis and metatarsus adductus Chronic left foot bunion with first metatarsophalangeal joint osteoarthritis and mild metatarsus adductus, presenting with worsening intermittent pain and increased discomfort since last evaluation. Radiographs reveal joint space narrowing, bony spurring, and mild elevatus of the first metatarsal, consistent with arthritic changes and structural deformity. Conservative management has not been attempted since the last visit. Surgical correction discussed as a definitive option to address both  the bony prominence and positional deformity. Risks of surgery include swelling, limited weight bearing, and prolonged recovery. Anticipated outcome is improved alignment and pain relief, though recurrence is possible if all deformities are not addressed. - Reviewed radiographic findings with her, including joint space narrowing, bony spurring, and mild metatarsus adductus. - Discussed conservative management: recommended wide and deep footwear, topical  diclofenac (Voltaren) or absorbine gel as needed for pain, and advised against toe spacers due to increased pain with attempted straightening. - Offered corticosteroid injection for symptomatic relief, noting it does not address the underlying deformity but may assist with pain management.  Patient declined - Discussed surgical correction as a definitive option: bunionectomy with osteotomy to realign the metatarsal and correct elevatus, with possible screw fixation. ([place] bunionectomy) - Explained surgical procedure details: outpatient setting, sedation with local anesthesia, anticipated recovery of 4-6 weeks with limited weight bearing and use of a surgical shoe.  Discussed risks involved with proceeding with surgical intervention. - Provided anticipatory guidance regarding post-operative care, including weekly follow-up, importance of foot elevation to minimize swelling, and considerations for work and FMLA paperwork if needed. - Advised her to contact the office if she elects to proceed with surgery, recommending a preoperative visit approximately six weeks prior to desired surgery date. - Recommended preoperative clearance with her family physician if she has not had a recent evaluation.   Awanda CHARM Imperial, DPM, FACFAS [facility]     [address] [place], [place] [address]                Office [phone]  Fax [phone]      [1]  Allergies Allergen Reactions   Cephalexin Other (See Comments) and Rash    Unknown    Mometasone Other (See Comments)    Other reaction(s): Other NOSE BLEEDS WITH NASONEX    Mometasone Furoate Other (See Comments)   Amoxicillin Diarrhea   Sulfa Antibiotics    "
